# Patient Record
Sex: Male | Born: 1960 | Race: Black or African American | Hispanic: No | Marital: Married | Smoking: Current every day smoker
Health system: Southern US, Community
[De-identification: ages and names within clinical notes are randomized; demographics above are authoritative.]

---

## 2009-04-23 ENCOUNTER — Emergency Department (HOSPITAL_COMMUNITY): Admission: EM | Admit: 2009-04-23 | Discharge: 2009-04-23 | Payer: Self-pay | Admitting: Emergency Medicine

## 2009-04-23 ENCOUNTER — Ambulatory Visit: Payer: Self-pay | Admitting: Psychiatry

## 2009-04-23 ENCOUNTER — Inpatient Hospital Stay (HOSPITAL_COMMUNITY): Admission: AD | Admit: 2009-04-23 | Discharge: 2009-04-26 | Payer: Self-pay | Admitting: Psychiatry

## 2009-05-03 ENCOUNTER — Emergency Department (HOSPITAL_COMMUNITY): Admission: EM | Admit: 2009-05-03 | Discharge: 2009-05-03 | Payer: Self-pay | Admitting: Emergency Medicine

## 2010-05-06 LAB — URINALYSIS, ROUTINE W REFLEX MICROSCOPIC
Bilirubin Urine: NEGATIVE
Hgb urine dipstick: NEGATIVE
Nitrite: NEGATIVE
Protein, ur: NEGATIVE mg/dL
Specific Gravity, Urine: 1.025 (ref 1.005–1.030)
Urobilinogen, UA: 0.2 mg/dL (ref 0.0–1.0)

## 2010-05-06 LAB — RAPID URINE DRUG SCREEN, HOSP PERFORMED
Amphetamines: NOT DETECTED
Barbiturates: NOT DETECTED
Opiates: NOT DETECTED
Tetrahydrocannabinol: NOT DETECTED

## 2010-05-06 LAB — DIFFERENTIAL
Basophils Absolute: 0 10*3/uL (ref 0.0–0.1)
Basophils Relative: 0 % (ref 0–1)
Eosinophils Absolute: 0 10*3/uL (ref 0.0–0.7)
Eosinophils Relative: 0 % (ref 0–5)
Lymphocytes Relative: 12 % (ref 12–46)
Lymphs Abs: 1.3 10*3/uL (ref 0.7–4.0)
Monocytes Absolute: 0.4 10*3/uL (ref 0.1–1.0)
Monocytes Relative: 4 % (ref 3–12)
Neutro Abs: 8.9 10*3/uL — ABNORMAL HIGH (ref 1.7–7.7)
Neutrophils Relative %: 84 % — ABNORMAL HIGH (ref 43–77)

## 2010-05-06 LAB — CBC
Hemoglobin: 14.7 g/dL (ref 13.0–17.0)
RBC: 4.75 MIL/uL (ref 4.22–5.81)
WBC: 10.6 10*3/uL — ABNORMAL HIGH (ref 4.0–10.5)

## 2010-05-06 LAB — BASIC METABOLIC PANEL
Calcium: 9 mg/dL (ref 8.4–10.5)
Creatinine, Ser: 0.95 mg/dL (ref 0.4–1.5)
GFR calc Af Amer: >60 mL/min (ref >60)
GFR calc non Af Amer: >60 mL/min (ref >60)
Sodium: 142 mEq/L (ref 135–145)

## 2010-05-06 LAB — SALICYLATE LEVEL: Salicylate Lvl: <4 mg/dL (ref 2.8–20.0)

## 2010-05-06 LAB — ETHANOL: Alcohol, Ethyl (B): <5 mg/dL (ref 0–10)

## 2010-09-03 ENCOUNTER — Emergency Department (HOSPITAL_COMMUNITY)
Admission: EM | Admit: 2010-09-03 | Discharge: 2010-09-04 | Disposition: A | Discharge status: Home / Self Care | Payer: Self-pay | Attending: Emergency Medicine | Admitting: Emergency Medicine

## 2010-09-03 DIAGNOSIS — M47817 Spondylosis without myelopathy or radiculopathy, lumbosacral region: Secondary | ICD-10-CM | POA: Insufficient documentation

## 2010-09-03 DIAGNOSIS — M1610 Unilateral primary osteoarthritis, unspecified hip: Secondary | ICD-10-CM | POA: Insufficient documentation

## 2010-09-03 DIAGNOSIS — M545 Low back pain, unspecified: Secondary | ICD-10-CM | POA: Insufficient documentation

## 2010-09-03 DIAGNOSIS — R350 Frequency of micturition: Secondary | ICD-10-CM | POA: Insufficient documentation

## 2010-09-03 DIAGNOSIS — F329 Major depressive disorder, single episode, unspecified: Secondary | ICD-10-CM | POA: Insufficient documentation

## 2010-09-03 DIAGNOSIS — R109 Unspecified abdominal pain: Secondary | ICD-10-CM | POA: Insufficient documentation

## 2010-09-03 DIAGNOSIS — G40909 Epilepsy, unspecified, not intractable, without status epilepticus: Secondary | ICD-10-CM | POA: Insufficient documentation

## 2010-09-03 DIAGNOSIS — W19XXXA Unspecified fall, initial encounter: Secondary | ICD-10-CM | POA: Insufficient documentation

## 2010-09-03 DIAGNOSIS — F3289 Other specified depressive episodes: Secondary | ICD-10-CM | POA: Insufficient documentation

## 2010-09-03 DIAGNOSIS — M169 Osteoarthritis of hip, unspecified: Secondary | ICD-10-CM | POA: Insufficient documentation

## 2010-09-04 ENCOUNTER — Emergency Department (HOSPITAL_COMMUNITY): Payer: Self-pay

## 2010-09-04 LAB — DIFFERENTIAL
Basophils Relative: 0 % (ref 0–1)
Lymphocytes Relative: 38 % (ref 12–46)
Monocytes Relative: 8 % (ref 3–12)
Neutro Abs: 3.6 10*3/uL (ref 1.7–7.7)
Neutrophils Relative %: 52 % (ref 43–77)

## 2010-09-04 LAB — CBC
HCT: 40.5 % (ref 39.0–52.0)
Hemoglobin: 14.4 g/dL (ref 13.0–17.0)
MCH: 32 pg (ref 26.0–34.0)
RBC: 4.5 MIL/uL (ref 4.22–5.81)

## 2010-09-04 LAB — POCT I-STAT, CHEM 8
BUN: 17 mg/dL (ref 6–23)
Chloride: 107 mEq/L (ref 96–112)
Creatinine, Ser: 0.8 mg/dL (ref 0.50–1.35)
Potassium: 3.5 mEq/L (ref 3.5–5.1)
Sodium: 142 mEq/L (ref 135–145)
TCO2: 23 mmol/L (ref 0–100)

## 2010-09-04 LAB — URINALYSIS, ROUTINE W REFLEX MICROSCOPIC
Glucose, UA: NEGATIVE mg/dL
Hgb urine dipstick: NEGATIVE
Ketones, ur: 15 mg/dL — AB
Leukocytes, UA: NEGATIVE
pH: 5.5 (ref 5.0–8.0)

## 2010-10-19 ENCOUNTER — Inpatient Hospital Stay (HOSPITAL_COMMUNITY)
Admission: AD | Admit: 2010-10-19 | Discharge: 2010-10-22 | DRG: 882 | Disposition: A | Discharge status: Home / Self Care | Payer: PRIVATE HEALTH INSURANCE | Source: Ambulatory Visit | Attending: Psychiatry | Admitting: Psychiatry

## 2010-10-19 DIAGNOSIS — IMO0002 Reserved for concepts with insufficient information to code with codable children: Secondary | ICD-10-CM

## 2010-10-19 DIAGNOSIS — F4323 Adjustment disorder with mixed anxiety and depressed mood: Secondary | ICD-10-CM

## 2010-10-19 DIAGNOSIS — R45851 Suicidal ideations: Secondary | ICD-10-CM

## 2010-10-19 DIAGNOSIS — M169 Osteoarthritis of hip, unspecified: Secondary | ICD-10-CM

## 2010-10-19 DIAGNOSIS — Z560 Unemployment, unspecified: Secondary | ICD-10-CM

## 2010-10-19 DIAGNOSIS — M1610 Unilateral primary osteoarthritis, unspecified hip: Secondary | ICD-10-CM

## 2010-10-20 LAB — COMPREHENSIVE METABOLIC PANEL
ALT: 14 U/L (ref 0–53)
Alkaline Phosphatase: 58 U/L (ref 39–117)
CO2: 25 mEq/L (ref 19–32)
GFR calc Af Amer: >60 mL/min (ref >60)
GFR calc non Af Amer: >60 mL/min (ref >60)
Glucose, Bld: 94 mg/dL (ref 70–99)
Potassium: 3.8 mEq/L (ref 3.5–5.1)
Sodium: 142 mEq/L (ref 135–145)
Total Bilirubin: 0.1 mg/dL — ABNORMAL LOW (ref 0.3–1.2)

## 2010-10-20 LAB — CBC
Hemoglobin: 14.6 g/dL (ref 13.0–17.0)
RBC: 4.73 MIL/uL (ref 4.22–5.81)

## 2010-10-20 LAB — TSH: TSH: 0.313 u[IU]/mL — ABNORMAL LOW (ref 0.350–4.500)

## 2010-10-24 NOTE — Discharge Summary (Signed)
  NAMEMarland Kitchen  Ian Santana, Ian Santana NO.:  [id number]  MEDICAL RECORD NO.:  [id number]  LOCATION:  0506                          FACILITY:  [facility]  PHYSICIAN:  Franchot Gallo, MD     DATE OF BIRTH:  March 07, 1960  DATE OF ADMISSION:  10/19/2010 DATE OF DISCHARGE:  10/22/2010                              DISCHARGE SUMMARY   REASON FOR ADMISSION:  This is a 50 year old male who presented with a suicidal plan with a plan to jump in front of a moving car.  He acknowledged he had used cocaine the night before.  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  FINAL IMPRESSION:   Axis I:  Adjustment disorder with mixed anxiety and depressed mood. Axis II:  Deferred. Axis III:  No acute illnesses. Axis IV:  Marital discord, work-related stress. Axis V: Global Assessment of Functioning at discharge is 70.  SIGNIFICANT FINDINGS:  The patient was admitted to the adult milieu for safety and stabilization and was empirically started on Remeron 50 mg for sleep.  He gave consent to speak with his wife.  He was participating in treatment and discharge planning groups and endorsed having marital problems.  On day of discharge, the patient was seen in the interdisciplinary treatment team.  He did not want to be started on any medications.  He felt he was ready to go home.  He was bright and cheerful and was ready for discharge.  His sleep was good.  Appetite was good.  His depression had resolved, rating it a 0 on a scale of 1-10, adamantly denying any suicidal or homicidal thoughts or psychotic symptoms.  His anxiety had resolved, rating it a 0 on a scale of 1-10.  DISCHARGE MEDICATIONS:  None.  FOLLOWUP:  Follow up with [facility] at phone number [phone].  The patient was to go to the walk-in clinic on Wednesday, September 12th.  He also had appointment with Irving Burton at [facility] on Friday, September 14th  at 9:00 a.m.  He was also to call [facility] at [phone] to arrange marital counseling if needed.     Landry Corporal, N.P.   ______________________________ Franchot Gallo, MD    JO/MEDQ  D:  10/23/2010  T:  10/24/2010  Job:  [id number]  Electronically Signed by Limmie PatriciaP. on 10/24/2010 09:25:40 AM Electronically Signed by Franchot Gallo MD on 10/24/2010 03:34:47 PM

## 2010-10-27 NOTE — Assessment & Plan Note (Signed)
NAMEMarland Santana  HUIE, Ian NO.:  [id number]  MEDICAL RECORD NO.:  [id number]  LOCATION:  0506                          FACILITY:  [facility]  PHYSICIAN:  Franchot Gallo, MD     DATE OF BIRTH:  1960-10-02  DATE OF ADMISSION:  10/19/2010 DATE OF DISCHARGE:                      PSYCHIATRIC ADMISSION ASSESSMENT   This is a 50 year old married African American male.  He presented as a walk-in here at the [facility] yesterday from [facility].  He reported he was suicidal, with a plan to jump in front of a moving car. He acknowledged that he had used cocaine the night before.  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  He quit because his wife refused to continue transporting him to work in their only vehicle.  He reports his spouse is verbally abusive, controlling.  She has 6 children, 2 of whom are autistic.  Apparently right now he says his thoughts are really mixed up.  He is quite irritable.  He is not willing to participate in the interview.  He states I already gave this information, etc. Unfortunately it was not presented that he had not been to the emergency department, so only his TSH was checked which actually is low.  It is 0.0313.  The range is 0.350 to 4.5, and this may in fact be his problem.  PAST PSYCHIATRIC HISTORY:  He was with [place] in March 2013.  He was here 03/14 to 03/17, and he was admitted at that time for depression with suicidal ideation.  Currently he is not on any medications.  It is not clear how long he continued psychiatric care after his last admission.  SOCIAL HISTORY:  He reports 1-1/2 years of college.  He was in the [employer] 79-81.  He has never activated his VA benefits.  He is currently married.  He has 5 biological children of his own.  His new wife has 6.  He quit his job.  He denies alcohol and drug history.  He says he did not use any crack, he  just used powder and that he just used it the 1 time.  He is not known to have a substance abuse issue.  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  MEDICATIONS:  He is not prescribed.  ALLERGIES:  He has no known drug allergies.  POSITIVE PHYSICAL FINDINGS:  We are getting his routine labs.  They are pending.  MENTAL STATUS EXAM:  AXIS I:  He is alert and oriented.  He is casually but appropriately groomed, dressed and nourished.  His speech is not pressured.  His mood is irritable.  His thought processes are somewhat clear, rational and goal oriented.  Judgment and insight are poor. Concentration and memory superficially are intact.  Intelligence is average.  He is suicidal, with a plan to jump in front of a car.  He is not homicidal.  He is not having any auditory or visual hallucinations that we are aware of.  Cocaine abuse by his report.  Depressive disorder versus adjustment disorder with mixed reaction of emotions  and conduct. AXIS II:  Is deferred. 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  Legal history: He was has a history 15 years ago of being incarcerated due to possession. AXIS V:  35.  PLAN:  Admit for safety and stabilization.  He was empirically started on Remeron 15 mg at sleep.  As already stated, his labs will be further checked, and further adjustments will be done as indicated.  He will probably need to have further evaluation of his hyperthyroid condition.  Estimated length of stay is 3-5 days.     Mickie Leonarda Salon, P.A.-C.   ______________________________ Franchot Gallo, MD    MD/MEDQ  D:  10/20/2010  T:  10/20/2010  Job:  [id number]  Electronically Signed by Jaci Lazier ADAMS P.A.-C. on 10/27/2010 12:24:10 PM Electronically Signed by Franchot Gallo MD on 10/27/2010 09:21:14 PM

## 2011-11-19 ENCOUNTER — Telehealth: Payer: Self-pay

## 2011-11-19 NOTE — Telephone Encounter (Signed)
Spoke with patient he has not been here at [facility] at a Patient or for Work.

## 2011-11-19 NOTE — Telephone Encounter (Signed)
Patient request that we fax TB test to [phone]. Patient needs it ASAP or he could be suspended from work.

## 2012-09-03 IMAGING — CR DG PELVIS 1-2V
1 series · 1 of 1 positions shown · non-contrast
Comparison: None.

CLINICAL DATA: Pain post fall

PELVIS - 1-2 VIEW

[t pelvis ap]
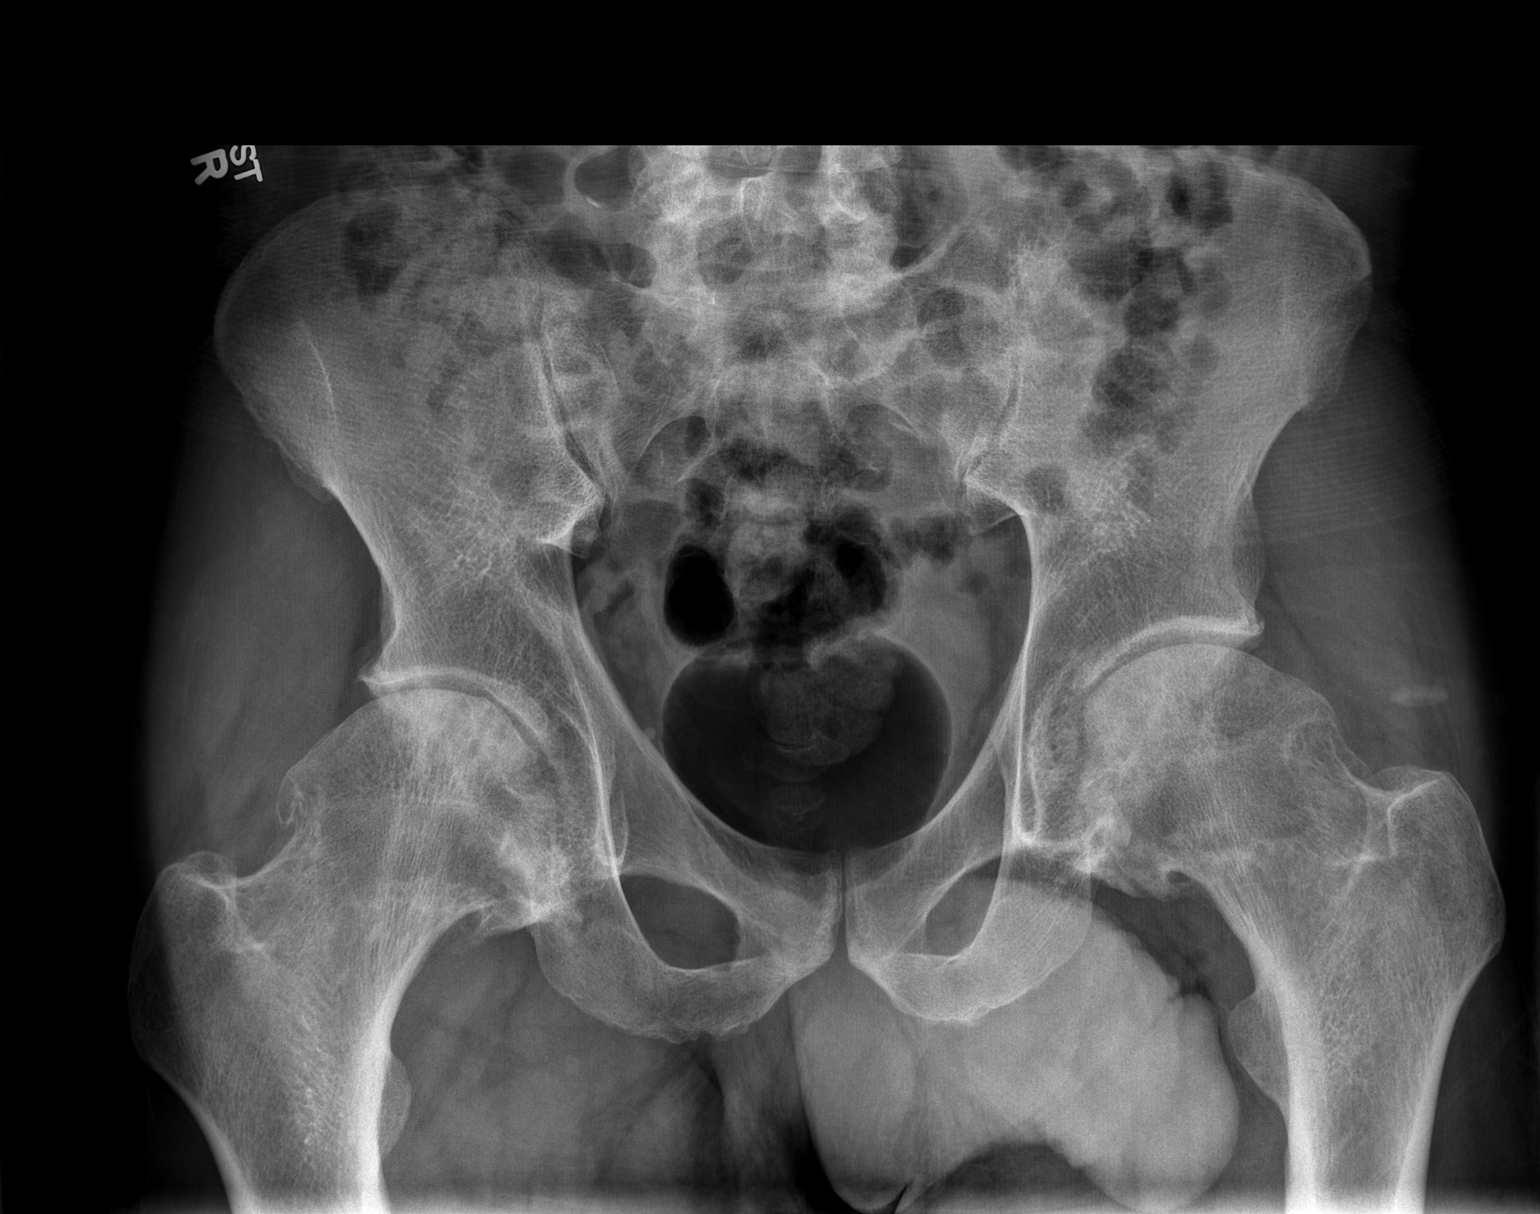

[1 of 1 positions shown; findings below may reference images not displayed]

FINDINGS: There is narrowing of the articular cartilage in both
hips with small marginal spurs from the femoral heads and superior
margins of the acetabula.  There is sclerosis in the femoral heads
with small subchondral cysts or geodes.  Negative for fracture,
dislocation, or other acute bony abnormality.
IMPRESSION: 1.  Moderately advanced bilateral hip osteoarthritis.  No fracture
or other acute abnormality.

## 2012-09-03 IMAGING — CR DG CHEST 2V
2 series · 2 of 2 positions shown · non-contrast
Comparison: None.

CLINICAL DATA: Weight loss.

CHEST - 2 VIEW

[w chest lat]
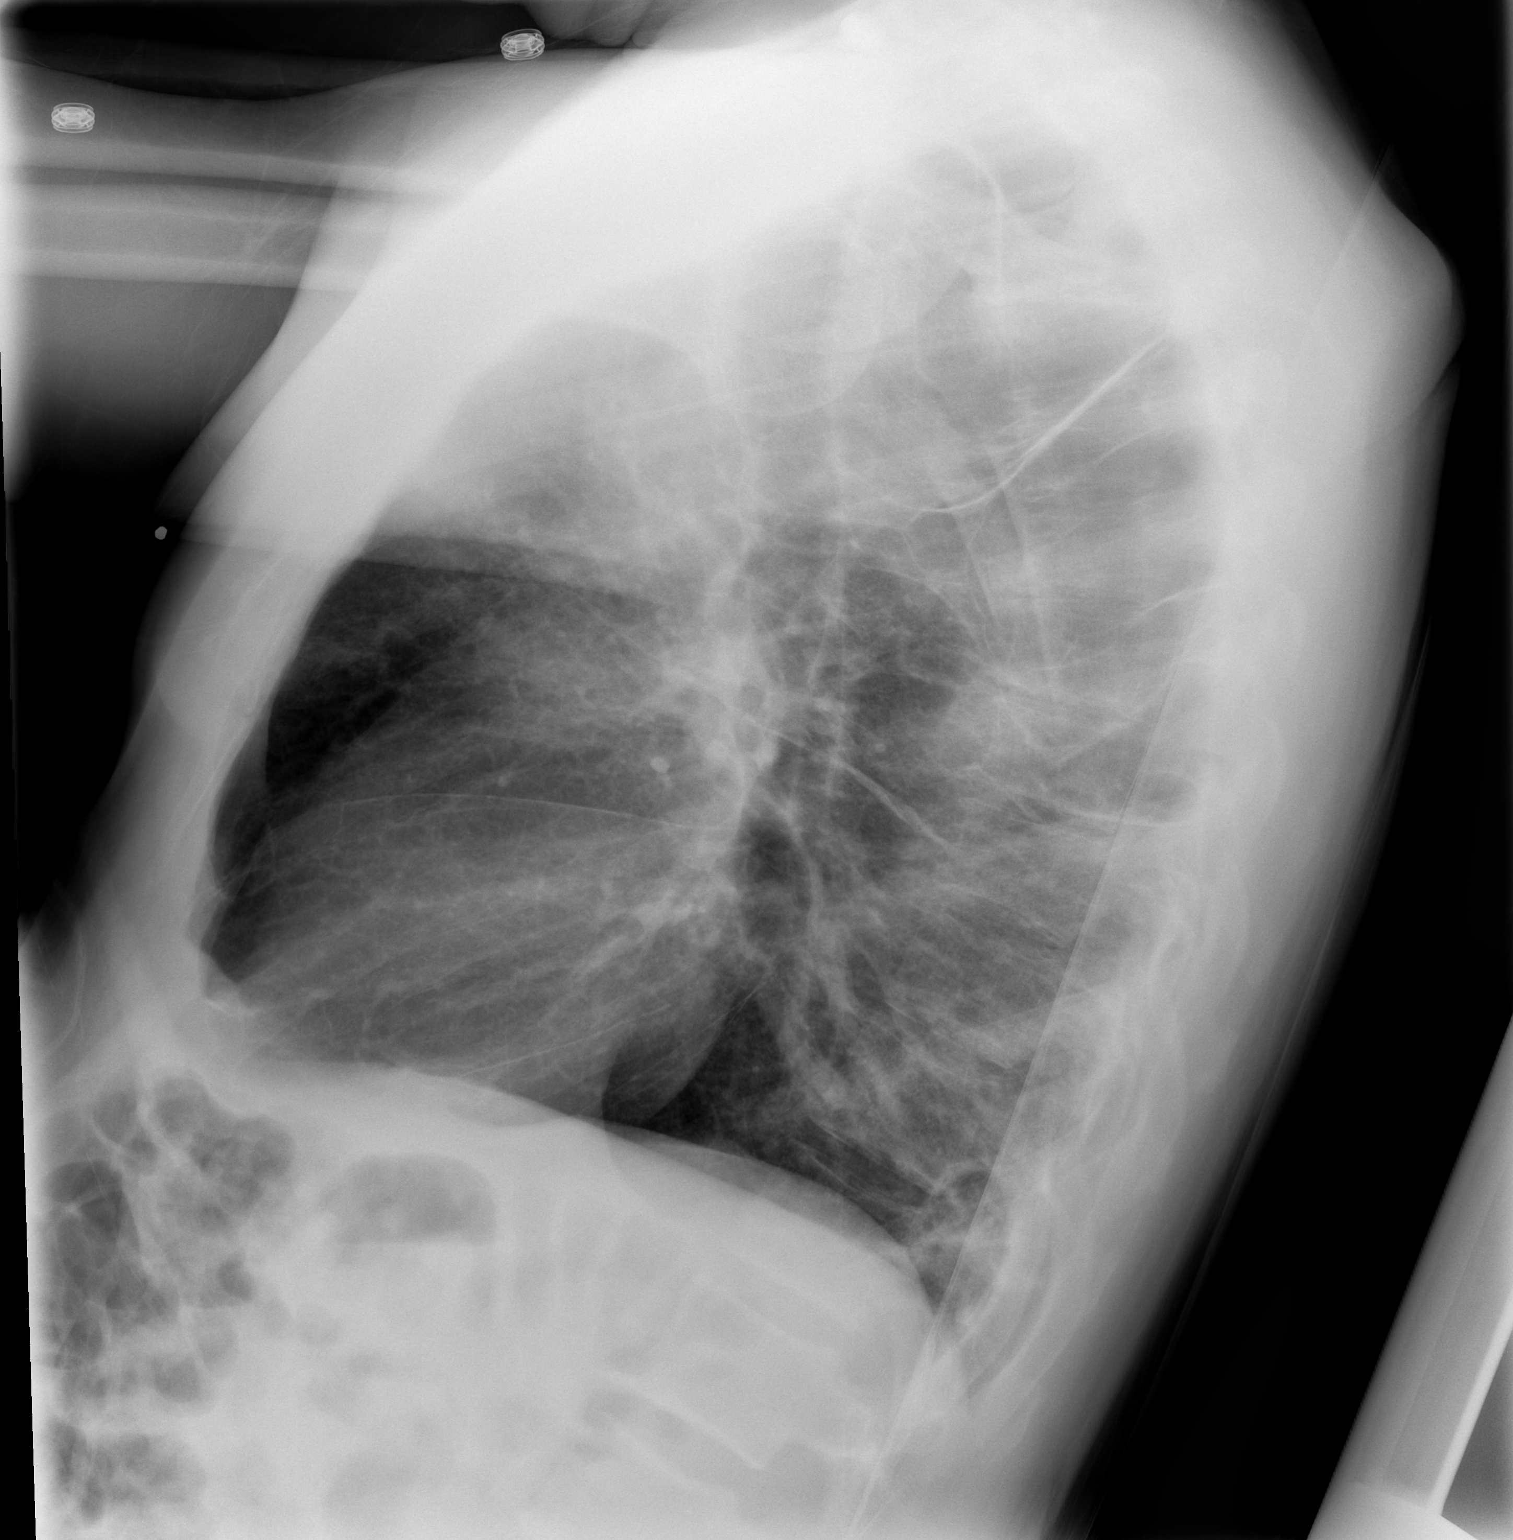

[w chest ap]
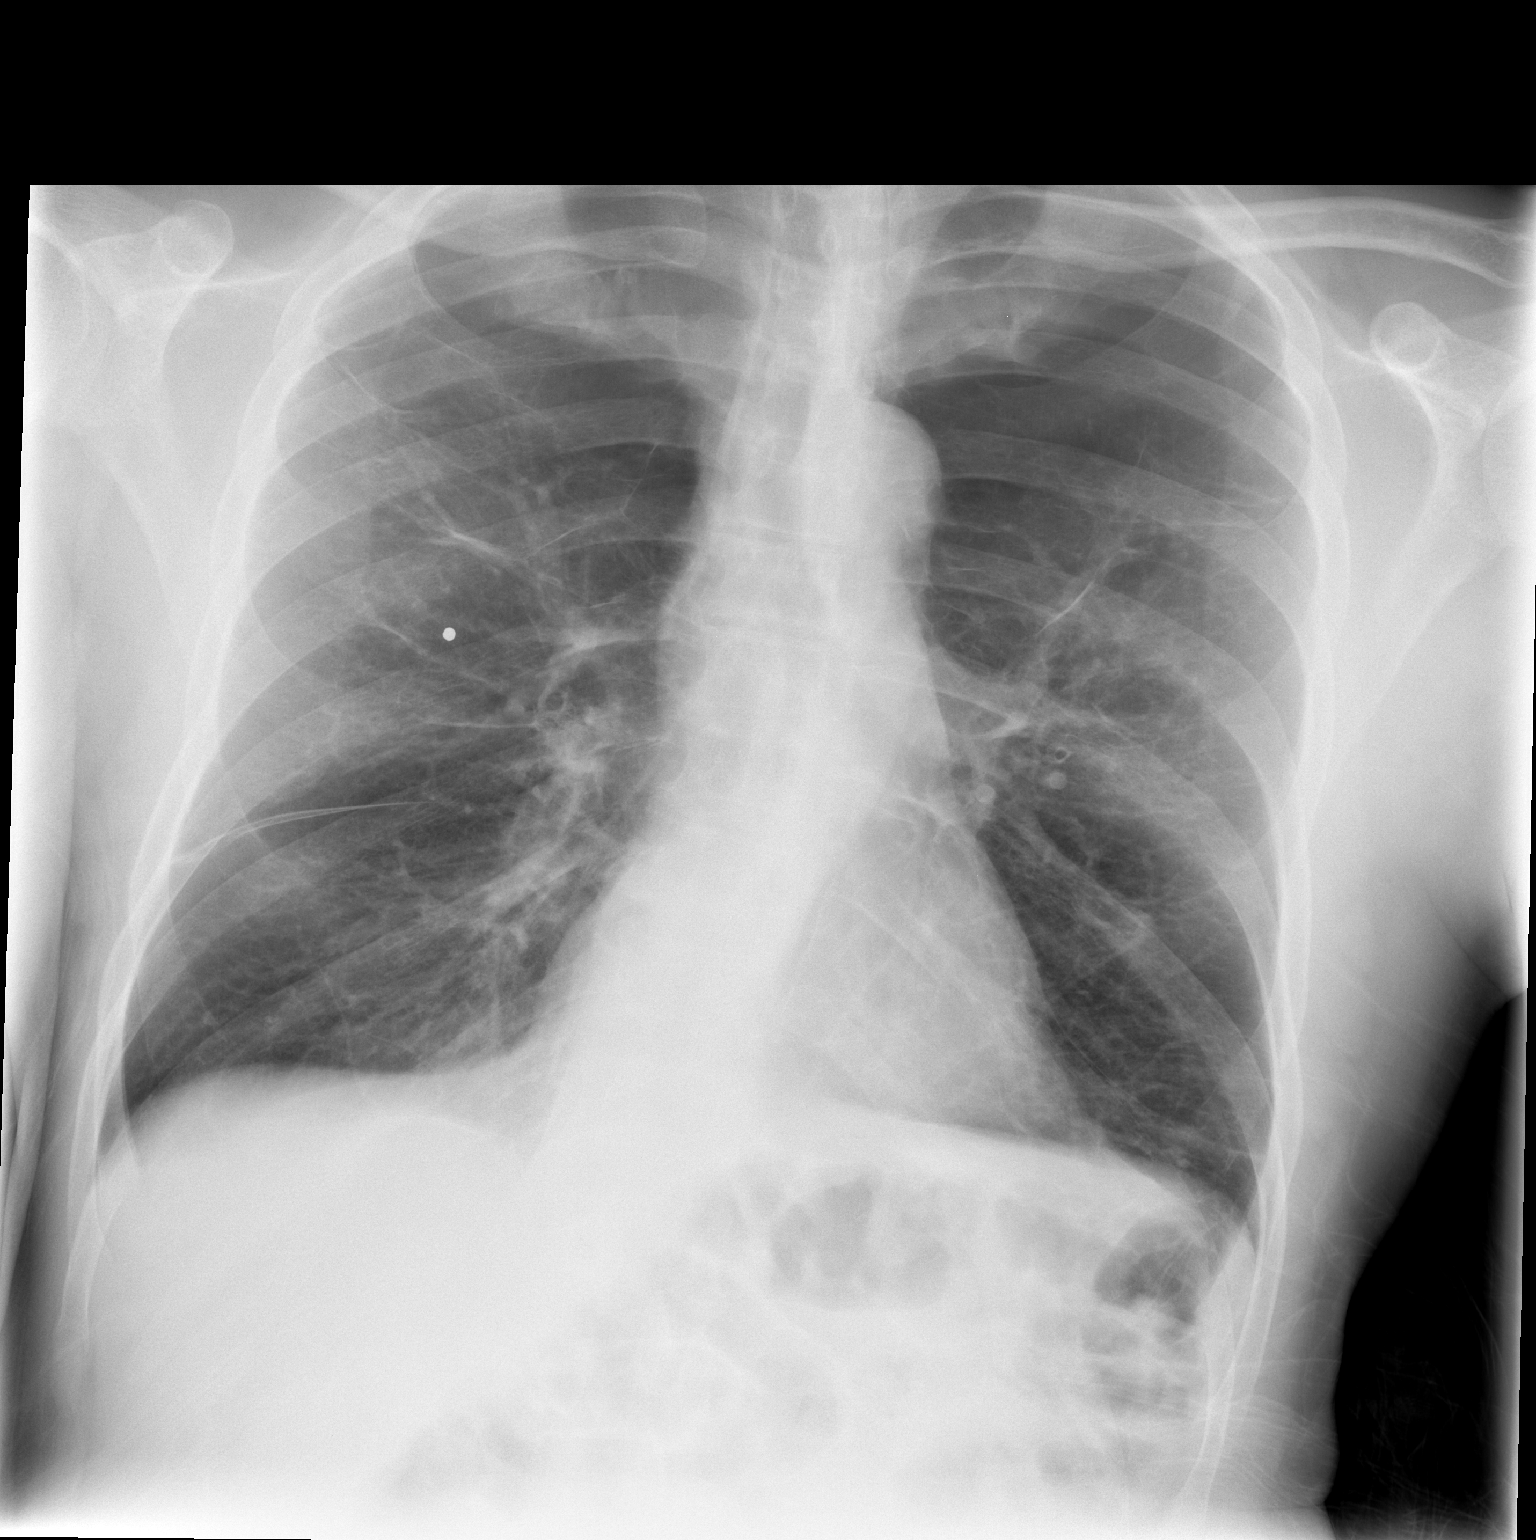

[2 of 2 positions shown; findings below may reference images not displayed]

FINDINGS: Coarse interstitial markings with bullous changes.
Metallic density projecting over the right chest may be sequelae of
prior trauma. No pleural effusion or focal consolidation
identified.  No definite pneumothorax.  Cardiomediastinal contours
within normal limits.  Mild multilevel degenerative changes.
IMPRESSION: COPD changes. No definite acute process identified.

## 2012-09-03 IMAGING — CR DG LUMBAR SPINE COMPLETE 4+V
5 series · 5 of 5 positions shown · non-contrast
Comparison: None.

CLINICAL DATA: Fell, low back pain

LUMBAR SPINE - COMPLETE 4+ VIEW

[t lumbar spine ap]
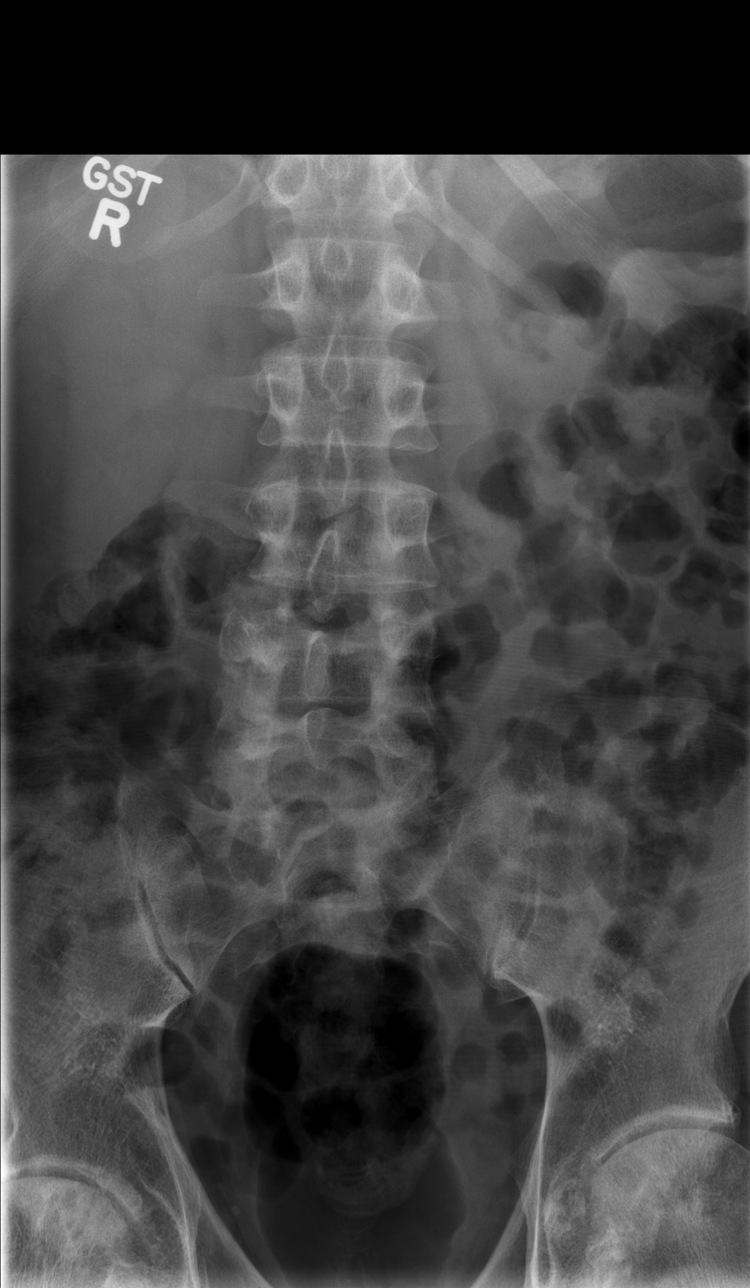

[t lumbar spine obl (1 of 2)]
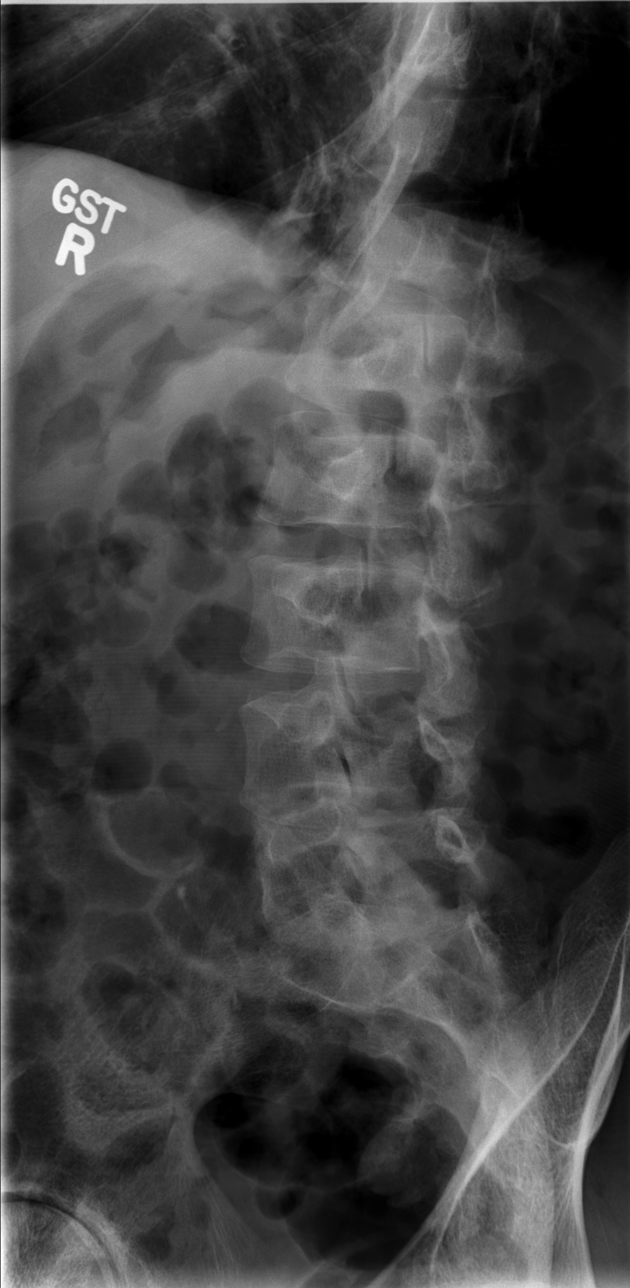

[t lumbar spine obl (2 of 2)]
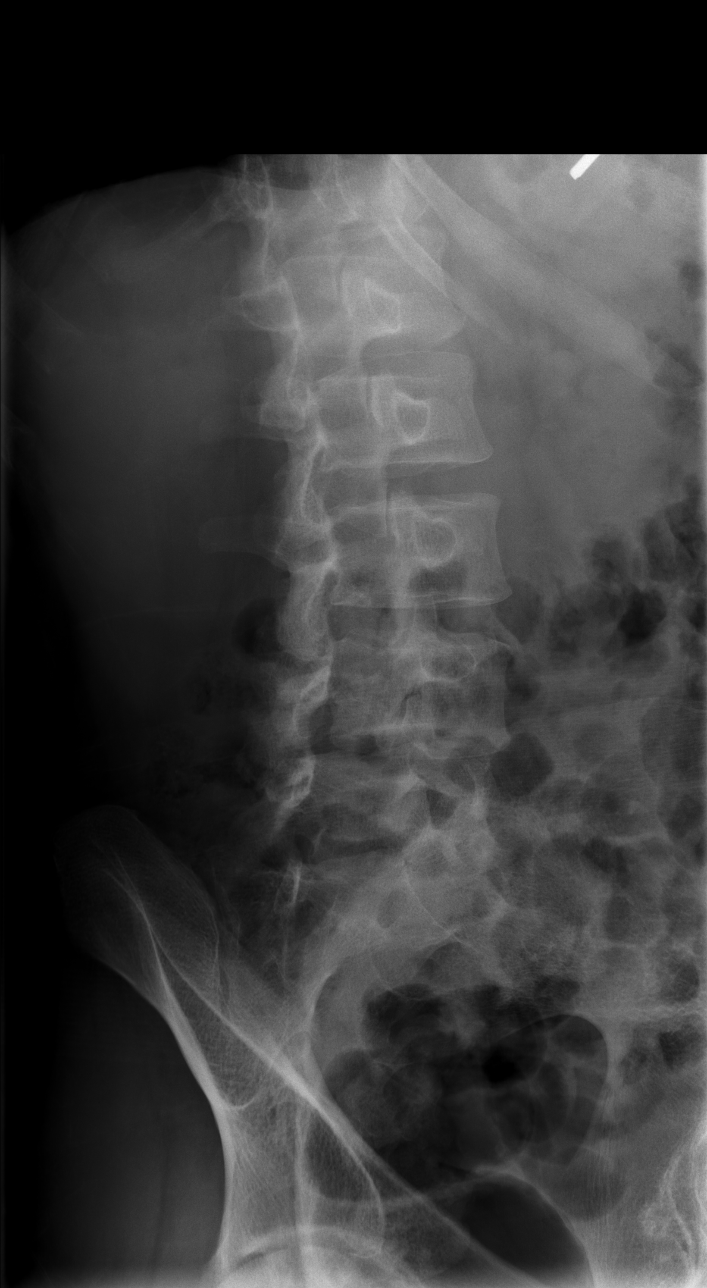

[t lumbar spine lat]
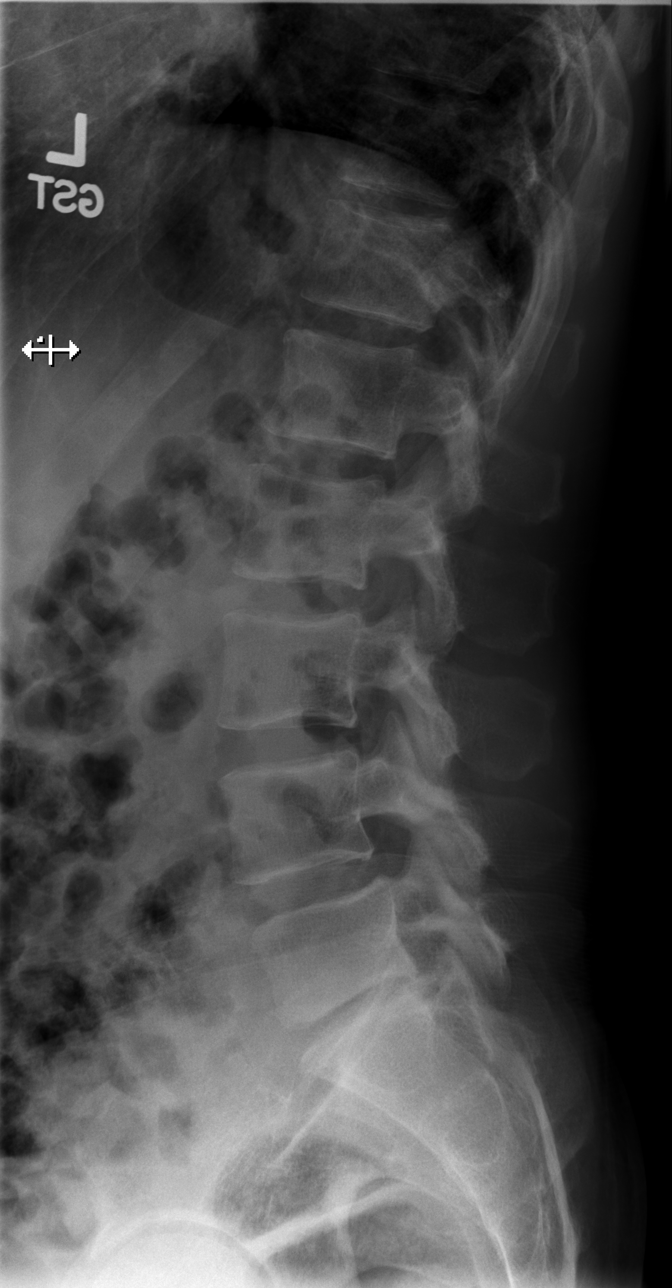

[t lumbar l-5 s-1 spot]
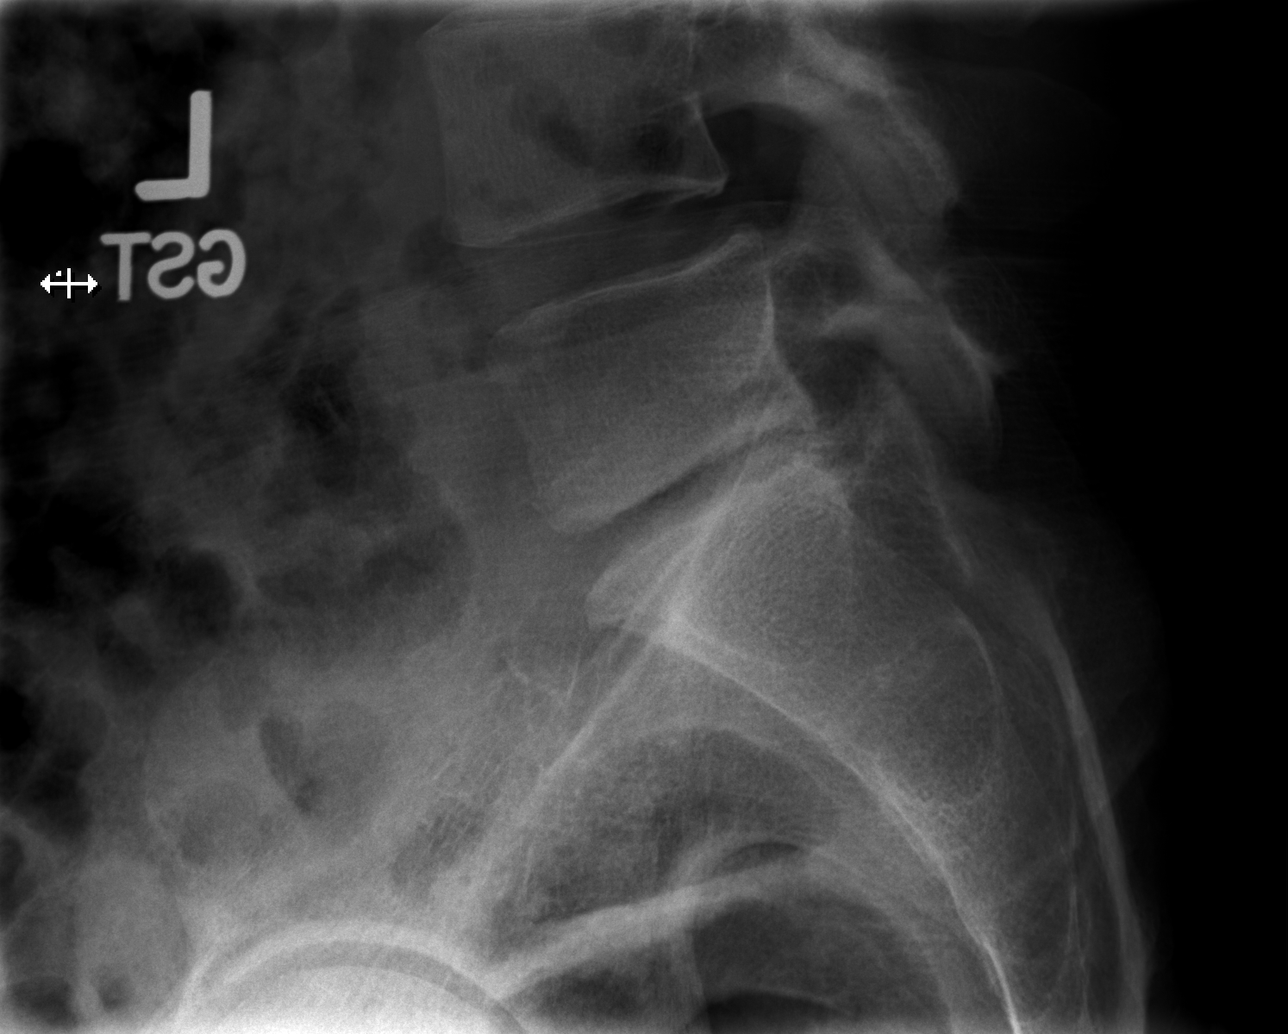

[5 of 5 positions shown; findings below may reference images not displayed]

FINDINGS: Degenerative changes in bilateral hips, incompletely
visualized.    There is mild narrowing of the L5-S1 interspace.
Vertebral body height well maintained throughout. Negative for
fracture.
IMPRESSION: 1. Early degenerative disc disease L5-S1.
2.  Negative for fracture.

## 2015-12-05 ENCOUNTER — Emergency Department (HOSPITAL_COMMUNITY): Payer: Self-pay

## 2015-12-05 ENCOUNTER — Encounter (HOSPITAL_COMMUNITY): Payer: Self-pay

## 2015-12-05 ENCOUNTER — Inpatient Hospital Stay (HOSPITAL_COMMUNITY)
Admission: EM | Admit: 2015-12-05 | Discharge: 2015-12-07 | DRG: 190 | Disposition: A | Discharge status: Home / Self Care | Payer: Self-pay | Attending: Internal Medicine | Admitting: Internal Medicine

## 2015-12-05 DIAGNOSIS — T886XXA Anaphylactic reaction due to adverse effect of correct drug or medicament properly administered, initial encounter: Secondary | ICD-10-CM | POA: Diagnosis not present

## 2015-12-05 DIAGNOSIS — Y92239 Unspecified place in hospital as the place of occurrence of the external cause: Secondary | ICD-10-CM | POA: Diagnosis not present

## 2015-12-05 DIAGNOSIS — F17200 Nicotine dependence, unspecified, uncomplicated: Secondary | ICD-10-CM | POA: Diagnosis present

## 2015-12-05 DIAGNOSIS — E43 Unspecified severe protein-calorie malnutrition: Secondary | ICD-10-CM | POA: Diagnosis present

## 2015-12-05 DIAGNOSIS — T464X5A Adverse effect of angiotensin-converting-enzyme inhibitors, initial encounter: Secondary | ICD-10-CM | POA: Diagnosis not present

## 2015-12-05 DIAGNOSIS — J069 Acute upper respiratory infection, unspecified: Secondary | ICD-10-CM | POA: Diagnosis present

## 2015-12-05 DIAGNOSIS — Z888 Allergy status to other drugs, medicaments and biological substances status: Secondary | ICD-10-CM

## 2015-12-05 DIAGNOSIS — R634 Abnormal weight loss: Secondary | ICD-10-CM

## 2015-12-05 DIAGNOSIS — B9789 Other viral agents as the cause of diseases classified elsewhere: Secondary | ICD-10-CM

## 2015-12-05 DIAGNOSIS — Z681 Body mass index (BMI) 19 or less, adult: Secondary | ICD-10-CM

## 2015-12-05 DIAGNOSIS — I10 Essential (primary) hypertension: Secondary | ICD-10-CM | POA: Diagnosis present

## 2015-12-05 DIAGNOSIS — R0682 Tachypnea, not elsewhere classified: Secondary | ICD-10-CM | POA: Diagnosis present

## 2015-12-05 DIAGNOSIS — B348 Other viral infections of unspecified site: Secondary | ICD-10-CM

## 2015-12-05 DIAGNOSIS — J441 Chronic obstructive pulmonary disease with (acute) exacerbation: Principal | ICD-10-CM | POA: Diagnosis present

## 2015-12-05 LAB — BASIC METABOLIC PANEL
ANION GAP: 7 (ref 5–15)
BUN: 12 mg/dL (ref 6–20)
CHLORIDE: 110 mmol/L (ref 101–111)
CO2: 23 mmol/L (ref 22–32)
Calcium: 8.8 mg/dL — ABNORMAL LOW (ref 8.9–10.3)
Creatinine, Ser: 1 mg/dL (ref 0.61–1.24)
Glucose, Bld: 70 mg/dL (ref 65–99)
POTASSIUM: 3.9 mmol/L (ref 3.5–5.1)
SODIUM: 140 mmol/L (ref 135–145)

## 2015-12-05 LAB — CBC WITH DIFFERENTIAL/PLATELET
BASOS ABS: 0 10*3/uL (ref 0.0–0.1)
Basophils Relative: 1 %
Eosinophils Absolute: 0.1 10*3/uL (ref 0.0–0.7)
Eosinophils Relative: 2 %
HCT: 42.1 % (ref 39.0–52.0)
HEMOGLOBIN: 14.8 g/dL (ref 13.0–17.0)
LYMPHS ABS: 1.9 10*3/uL (ref 0.7–4.0)
LYMPHS PCT: 36 %
MCH: 32 pg (ref 26.0–34.0)
MCHC: 35.2 g/dL (ref 30.0–36.0)
MCV: 91.1 fL (ref 78.0–100.0)
Monocytes Absolute: 0.4 10*3/uL (ref 0.1–1.0)
Monocytes Relative: 8 %
NEUTROS ABS: 2.9 10*3/uL (ref 1.7–7.7)
NEUTROS PCT: 53 %
Platelets: 285 10*3/uL (ref 150–400)
RBC: 4.62 MIL/uL (ref 4.22–5.81)
RDW: 14.4 % (ref 11.5–15.5)
WBC: 5.4 10*3/uL (ref 4.0–10.5)

## 2015-12-05 LAB — I-STAT TROPONIN, ED: Troponin i, poc: 0 ng/mL (ref 0.00–0.08)

## 2015-12-05 LAB — I-STAT CG4 LACTIC ACID, ED: LACTIC ACID, VENOUS: 1.48 mmol/L (ref 0.5–1.9)

## 2015-12-05 MED ORDER — AZITHROMYCIN 250 MG PO TABS
500.0000 mg | ORAL_TABLET | Freq: Once | ORAL | Status: AC
  Administered 2015-12-05: 500 mg via ORAL
  Filled 2015-12-05: qty 2

## 2015-12-05 MED ORDER — IPRATROPIUM-ALBUTEROL 0.5-2.5 (3) MG/3ML IN SOLN
3.0000 mL | Freq: Once | RESPIRATORY_TRACT | Status: AC
  Administered 2015-12-05: 3 mL via RESPIRATORY_TRACT
  Filled 2015-12-05: qty 3

## 2015-12-05 MED ORDER — METHYLPREDNISOLONE SODIUM SUCC 125 MG IJ SOLR
125.0000 mg | Freq: Once | INTRAMUSCULAR | Status: AC
  Administered 2015-12-05: 125 mg via INTRAVENOUS
  Filled 2015-12-05: qty 2

## 2015-12-05 NOTE — Progress Notes (Signed)
Patient listed as not having a pcp or insurance.  EDCM spoke to patient at bedside.  [place]  EDCM provided patient [place] who accept patients without insurance.  EDCM also provided patient with contact information to the [facility] and DSS of [place].  Encouraged patient to see if he would qualify for Medicaid.  Patient thankful for services.  No further EDCM needs at this time.

## 2015-12-05 NOTE — ED Triage Notes (Signed)
Pt complains of a productive cough, body aches and being short of breath for two days, was seen by his primary Dr and sent here for further evaluation and possible pneumonia

## 2015-12-05 NOTE — ED Notes (Signed)
Pt transported to radiology.

## 2015-12-05 NOTE — ED Provider Notes (Signed)
[facility]-EMERGENCY DEPT Provider Note   CSN: [id number] Arrival date & time: 12/05/15  1842     History   Chief Complaint Chief Complaint  Patient presents with  . Cough    HPI Ian Santana is a 55 y.o. male.  55yo M w/ h/o tobacco use who p/w cough, body aches, and SOB. A few days ago the patient developed a cough associated with runny nose and mild sore throat. He has had associated subjective fevers and chills. He has had a gradually progressive shortness of breath since yesterday. Today he began having anal on bilateral ribs that is worse with coughing. No sick contacts, recent travel. No rash. He endorses generalized joint pains and body aches. He has had some nausea and vomiting, no diarrhea. He was seen by PCP who sent him here for further evaluation. He tested negative for influenza at the office.   The history is provided by the patient.  Cough     History reviewed. No pertinent past medical history.  Patient Active Problem List   Diagnosis Date Noted  . COPD exacerbation (HCC) 12/06/2015    History reviewed. No pertinent surgical history.     Home Medications    Prior to Admission medications   Not on File    Family History History reviewed. No pertinent family history.  Social History Social History  Substance Use Topics  . Smoking status: Current Every Day Smoker  . Smokeless tobacco: Never Used  . Alcohol use No     Allergies   Review of patient's allergies indicates no known allergies.   Review of Systems Review of Systems  Respiratory: Positive for cough.    10 Systems reviewed and are negative for acute change except as noted in the HPI.   Physical Exam Updated Vital Signs BP (!) 146/109   Pulse 74   Temp 99 F (37.2 C) (Oral)   Resp 20   Ht 6\' 5"  (1.956 m)   Wt 212 lb (96.2 kg)   SpO2 (!) 89%   BMI 25.14 kg/m   Physical Exam  Constitutional: He is oriented to person, place, and time. He appears well-developed and  well-nourished. No distress.  uncomfortable  HENT:  Head: Normocephalic and atraumatic.  Mouth/Throat: Oropharynx is clear and moist.  Moist mucous membranes  Eyes: Conjunctivae are normal. Pupils are equal, round, and reactive to light.  Neck: Neck supple.  Cardiovascular: Normal rate, regular rhythm and normal heart sounds.   No murmur heard. Pulmonary/Chest:  Mild tachypnea; expiratory wheezes R upper and lower lung, diminished BS L lung  Abdominal: Soft. Bowel sounds are normal. He exhibits no distension. There is no tenderness.  Musculoskeletal: He exhibits no edema.  Neurological: He is alert and oriented to person, place, and time.  Fluent speech  Skin: Skin is warm and dry. No rash noted.  Psychiatric: He has a normal mood and affect. Judgment normal.  Nursing note and vitals reviewed.    ED Treatments / Results  Labs (all labs ordered are listed, but only abnormal results are displayed) Labs Reviewed  BASIC METABOLIC PANEL - Abnormal; Notable for the following:       Result Value   Calcium 8.8 (*)    All other components within normal limits  CULTURE, BLOOD (ROUTINE X 2)  CULTURE, BLOOD (ROUTINE X 2)  CBC WITH DIFFERENTIAL/PLATELET  I-STAT CG4 LACTIC ACID, ED  Rosezena Sensor, ED    EKG  EKG Interpretation  Date/Time:  Wednesday December 05 2015 20:27:44 EDT Ventricular  Rate:  78 PR Interval:    QRS Duration: 75 QT Interval:  360 QTC Calculation: 410 R Axis:   82 Text Interpretation:  Sinus rhythm Ventricular premature complex LAE, consider biatrial enlargement Anteroseptal infarct, old Nonspecific T abnormalities, inferior leads No previous ECGs available Confirmed by Landon Truax MD, Lorna Strother [phone]([id number]) on 12/05/2015 9:02:19 PM       Radiology Dg Chest 2 View  Result Date: 12/05/2015 CLINICAL DATA:  Productive cough and body ache EXAM: CHEST  2 VIEW COMPARISON:  09/04/2010 FINDINGS: Upper lobe predominant bullous emphysema is again seen with crowding of lower  lobe interstitial lung markings. There is hyperinflation of the lungs bilaterally without pneumonic consolidation, CHF, effusions or pneumothorax. The heart and mediastinal contours are stable with mild uncoiling of the thoracic aorta. No acute osseous abnormality. IMPRESSION: COPD without acute pneumonic consolidation. Electronically Signed   By: Tollie Ethavid  Kwon M.D.   On: 12/05/2015 20:52    Procedures Procedures (including critical care time)  Medications Ordered in ED Medications  ipratropium-albuterol (DUONEB) 0.5-2.5 (3) MG/3ML nebulizer solution 3 mL (3 mLs Nebulization Given 12/05/15 2124)  methylPREDNISolone sodium succinate (SOLU-MEDROL) 125 mg/2 mL injection 125 mg (125 mg Intravenous Given 12/05/15 2121)  azithromycin (ZITHROMAX) tablet 500 mg (500 mg Oral Given 12/05/15 2120)  albuterol (PROVENTIL) (2.5 MG/3ML) 0.083% nebulizer solution 5 mg (5 mg Nebulization Given 12/06/15 0038)     Initial Impression / Assessment and Plan / ED Course  I have reviewed the triage vital signs and the nursing notes.  Pertinent labs & imaging results that were available during my care of the patient were reviewed by me and considered in my medical decision making (see chart for details).  Clinical Course   Pt p/w a few days of cough and nasal congestion, now w/ worsening SOB and b/l rib pain. He was awake and alert, in no acute distress at presentation. He was afebrile, O2 92% on room air, reassuring BP. I'm expiratory wheezing and diminished breath sounds on exam. Obtained above lab work as well as EKG and chest x-ray. EKG without acute ischemic changes. Chest x-ray shows COPD findings without any focal consolidation. The patient does not have history of COPD but does have significant smoking history. Gave DuoNeb, Solu-Medrol, and azithromycin.  Labwork unremarkable including normal lactate and troponin. Chest x-ray shows COPD changes without obvious infiltrates. On reexamination, the patient had more  obvious wheezing bilaterally, improved work of breathing but he does remain mildly hypoxic to high 80s during sleep, 92% when awake. Because of his need for frequent albuterol and his hypoxia, discussed admission with Dr. Antionette Charpyd, appreciate assistance. Pt admitted for further treatment. Final Clinical Impressions(s) / ED Diagnoses   Final diagnoses:  COPD exacerbation (HCC)  Viral URI with cough    New Prescriptions New Prescriptions   No medications on file     Laurence Spatesachel Morgan Shere Eisenhart, MD 12/06/15 0045

## 2015-12-06 ENCOUNTER — Encounter (HOSPITAL_COMMUNITY): Payer: Self-pay | Admitting: Family Medicine

## 2015-12-06 DIAGNOSIS — J069 Acute upper respiratory infection, unspecified: Secondary | ICD-10-CM

## 2015-12-06 DIAGNOSIS — I10 Essential (primary) hypertension: Secondary | ICD-10-CM

## 2015-12-06 DIAGNOSIS — J441 Chronic obstructive pulmonary disease with (acute) exacerbation: Secondary | ICD-10-CM | POA: Diagnosis present

## 2015-12-06 DIAGNOSIS — B348 Other viral infections of unspecified site: Secondary | ICD-10-CM

## 2015-12-06 DIAGNOSIS — F17200 Nicotine dependence, unspecified, uncomplicated: Secondary | ICD-10-CM

## 2015-12-06 LAB — RESPIRATORY PANEL BY PCR
Adenovirus: NOT DETECTED
BORDETELLA PERTUSSIS-RVPCR: NOT DETECTED
CHLAMYDOPHILA PNEUMONIAE-RVPPCR: NOT DETECTED
CORONAVIRUS 229E-RVPPCR: NOT DETECTED
CORONAVIRUS HKU1-RVPPCR: NOT DETECTED
Coronavirus NL63: NOT DETECTED
Coronavirus OC43: NOT DETECTED
Influenza A: NOT DETECTED
Influenza B: NOT DETECTED
METAPNEUMOVIRUS-RVPPCR: NOT DETECTED
Mycoplasma pneumoniae: NOT DETECTED
PARAINFLUENZA VIRUS 2-RVPPCR: NOT DETECTED
Parainfluenza Virus 1: NOT DETECTED
Parainfluenza Virus 3: NOT DETECTED
Parainfluenza Virus 4: NOT DETECTED
Respiratory Syncytial Virus: NOT DETECTED
Rhinovirus / Enterovirus: DETECTED — AB

## 2015-12-06 LAB — GLUCOSE, CAPILLARY: GLUCOSE-CAPILLARY: 217 mg/dL — AB (ref 65–99)

## 2015-12-06 MED ORDER — ACETAMINOPHEN 650 MG RE SUPP: 650.0000 mg | Freq: Four times a day (QID) | RECTAL | Status: DC | PRN

## 2015-12-06 MED ORDER — SODIUM CHLORIDE 0.9 % IV SOLN: 250.0000 mL | INTRAVENOUS | Status: DC | PRN

## 2015-12-06 MED ORDER — PREDNISONE 5 MG PO TABS: 5.0000 mg | ORAL_TABLET | Freq: Every day | ORAL | Status: AC

## 2015-12-06 MED ORDER — ENOXAPARIN SODIUM 40 MG/0.4ML ~~LOC~~ SOLN
40.0000 mg | SUBCUTANEOUS | Status: DC
  Administered 2015-12-06: 40 mg via SUBCUTANEOUS
  Filled 2015-12-06 (×2): qty 0.4

## 2015-12-06 MED ORDER — METHYLPREDNISOLONE SODIUM SUCC 125 MG IJ SOLR
60.0000 mg | Freq: Three times a day (TID) | INTRAMUSCULAR | Status: DC
  Administered 2015-12-06 – 2015-12-07 (×4): 60 mg via INTRAVENOUS
  Filled 2015-12-06 (×6): qty 2

## 2015-12-06 MED ORDER — KETOROLAC TROMETHAMINE 30 MG/ML IJ SOLN
30.0000 mg | Freq: Four times a day (QID) | INTRAMUSCULAR | Status: DC | PRN
  Administered 2015-12-07: 30 mg via INTRAVENOUS
  Filled 2015-12-06: qty 1

## 2015-12-06 MED ORDER — ONDANSETRON HCL 4 MG PO TABS: 4.0000 mg | ORAL_TABLET | Freq: Four times a day (QID) | ORAL | Status: DC | PRN

## 2015-12-06 MED ORDER — HYDRALAZINE HCL 20 MG/ML IJ SOLN
10.0000 mg | INTRAMUSCULAR | Status: DC | PRN
Start: 2015-12-06 — End: 2015-12-07

## 2015-12-06 MED ORDER — ONDANSETRON HCL 4 MG/2ML IJ SOLN: 4.0000 mg | Freq: Four times a day (QID) | INTRAMUSCULAR | Status: DC | PRN

## 2015-12-06 MED ORDER — ALBUTEROL SULFATE (2.5 MG/3ML) 0.083% IN NEBU
5.0000 mg | INHALATION_SOLUTION | Freq: Once | RESPIRATORY_TRACT | Status: AC
  Administered 2015-12-06: 5 mg via RESPIRATORY_TRACT
  Filled 2015-12-06: qty 6

## 2015-12-06 MED ORDER — ENSURE ENLIVE PO LIQD
237.0000 mL | Freq: Two times a day (BID) | ORAL | Status: DC
  Administered 2015-12-06 (×2): 237 mL via ORAL

## 2015-12-06 MED ORDER — LEVOFLOXACIN IN D5W 750 MG/150ML IV SOLN
750.0000 mg | INTRAVENOUS | Status: DC
  Administered 2015-12-06: 750 mg via INTRAVENOUS
  Filled 2015-12-06: qty 150

## 2015-12-06 MED ORDER — LISINOPRIL 5 MG PO TABS
5.0000 mg | ORAL_TABLET | Freq: Every day | ORAL | Status: DC
  Administered 2015-12-06: 5 mg via ORAL
  Filled 2015-12-06: qty 1

## 2015-12-06 MED ORDER — IPRATROPIUM-ALBUTEROL 0.5-2.5 (3) MG/3ML IN SOLN
3.0000 mL | RESPIRATORY_TRACT | Status: DC | PRN
  Administered 2015-12-07: 3 mL via RESPIRATORY_TRACT
  Filled 2015-12-06: qty 3

## 2015-12-06 MED ORDER — ACETAMINOPHEN 325 MG PO TABS: 650.0000 mg | ORAL_TABLET | Freq: Four times a day (QID) | ORAL | Status: DC | PRN

## 2015-12-06 MED ORDER — KETOROLAC TROMETHAMINE 10 MG PO TABS: 10.0000 mg | ORAL_TABLET | Freq: Four times a day (QID) | ORAL | 0 refills | Status: AC | PRN

## 2015-12-06 MED ORDER — SODIUM CHLORIDE 0.9% FLUSH
3.0000 mL | Freq: Two times a day (BID) | INTRAVENOUS | Status: DC
  Administered 2015-12-06 – 2015-12-07 (×3): 3 mL via INTRAVENOUS

## 2015-12-06 MED ORDER — ALBUTEROL SULFATE HFA 108 (90 BASE) MCG/ACT IN AERS: 2.0000 | INHALATION_SPRAY | Freq: Four times a day (QID) | RESPIRATORY_TRACT | 0 refills | Status: AC | PRN

## 2015-12-06 MED ORDER — SODIUM CHLORIDE 0.9 % IV BOLUS (SEPSIS)
1000.0000 mL | Freq: Once | INTRAVENOUS | Status: AC
  Administered 2015-12-06: 1000 mL via INTRAVENOUS

## 2015-12-06 MED ORDER — SODIUM CHLORIDE 0.9% FLUSH
3.0000 mL | INTRAVENOUS | Status: DC | PRN
  Administered 2015-12-06 – 2015-12-07 (×2): 3 mL via INTRAVENOUS
  Filled 2015-12-06 (×2): qty 3

## 2015-12-06 MED ORDER — HYDROCODONE-ACETAMINOPHEN 5-325 MG PO TABS
1.0000 | ORAL_TABLET | ORAL | Status: DC | PRN
  Administered 2015-12-06 (×4): 2 via ORAL
  Filled 2015-12-06 (×5): qty 2

## 2015-12-06 MED ORDER — BENZONATATE 100 MG PO CAPS
100.0000 mg | ORAL_CAPSULE | Freq: Three times a day (TID) | ORAL | Status: DC | PRN
  Administered 2015-12-07: 100 mg via ORAL
  Filled 2015-12-06: qty 1

## 2015-12-06 MED ORDER — POLYETHYLENE GLYCOL 3350 17 G PO PACK: 17.0000 g | PACK | Freq: Every day | ORAL | Status: DC | PRN

## 2015-12-06 NOTE — Plan of Care (Signed)
Problem: Education: Goal: Knowledge of [facility] Education information/materials will improve Outcome: Progressing Educated patient regarding smoking cessation. Patient acknowledges need to quit smoking. Reports he is going to quit.  In addition, printed out COPD patient education information.

## 2015-12-06 NOTE — H&P (Signed)
History and Physical    Ian Santana RUE:[id number] DOB: Sep 03, 1960 DOA: 12/05/2015  PCP: No primary care provider on file.   Patient coming from: Home, by way of PCP office   Chief Complaint: Cough, SOB, aches, malaise  HPI: Ian Santana is a 55 y.o. male with medical history significant for ongoing tobacco abuse who presents to the emergency department at the direction of his PCP for evaluation of cough, dyspnea, and malaise. Patient reports that he had been in his usual state of good health until approximately one week ago when he developed copious clear rhinorrhea and sore throat. As these symptoms began to improve, patient developed a cough productive of thick white sputum and dyspnea with minimal exertion. He has also noted diffuse body aches and a general malaise. He reports subjective fevers and chills, but denies chest pain or palpitations. There has been no leg swelling or orthopnea. He has no personal or family history of VTE. Patient saw his primary care physician today for evaluation of this, was noted to be saturating 90% on room air and wheezing, was treated with DuoNeb in the office, rapid flu test was negative, and the patient was directed to the emergency department for further evaluation.  ED Course: Upon arrival to the ED, patient is found to be afebrile, saturating low 90s on room air, mildly tachypneic, and with diastolic blood pressure in the low 100s. EKG demonstrates sinus rhythm with PVC and T-wave flattening/inversion in the inferior leads. Chest x-ray findings are consistent with COPD without acute infiltrate. CMP and CBC are unremarkable, lactic acid is reassuring at 1.48, and troponin is undetectable. Blood cultures were obtained and the patient was treated with 125 mg IV Solu-Medrol, azithromycin, and duo nebs. Patient had some mild subjective improvement with these measures in the emergency department, but continued to be significantly dyspneic with minimal exertion. He will  be observed on the medical surgical unit for ongoing evaluation and management of suspected COPD with acute exacerbation, likely with infectious precipitant.  Review of Systems:  All other systems reviewed and apart from HPI, are negative.  History reviewed. No pertinent past medical history.  History reviewed. No pertinent surgical history.   reports that he has been smoking.  He has never used smokeless tobacco. He reports that he does not drink alcohol. His drug history is not on file.  No Known Allergies  History reviewed. No pertinent family history.   Prior to Admission medications   Not on File    Physical Exam: Vitals:   12/05/15 2230 12/05/15 2330 12/06/15 0000 12/06/15 0030  BP: (!) 148/109 131/87 140/99 (!) 146/109  Pulse: 73 77 77 74  Resp: 23 21 18 20   Temp:      TempSrc:      SpO2: 90% 96% 90% (!) 89%  Weight:      Height:          Constitutional: Mildly tachypneic, pausing every 4 words or so for breath; no pallor  Eyes: PERTLA, lids and conjunctivae normal ENMT: Mucous membranes are moist. Posterior pharynx clear of any exudate or lesions.   Neck: normal, supple, no masses, no thyromegaly Respiratory: Diminished bilaterally with scattered wheezes. No accessory muscle use.  Cardiovascular: S1 & S2 heard, regular rate and rhythm. No extremity edema. No significant JVD. Abdomen: No distension, no tenderness, no masses palpated. Bowel sounds normal.  Musculoskeletal: no clubbing / cyanosis. No joint deformity upper and lower extremities. Normal muscle tone.  Skin: no significant rashes, lesions, ulcers. Warm,  dry, well-perfused. Neurologic: CN 2-12 grossly intact. Sensation intact, DTR normal. Strength 5/5 in all 4 limbs.  Psychiatric: Normal judgment and insight. Alert and oriented x 3. Normal mood and affect.     Labs on Admission: I have personally reviewed following labs and imaging studies  CBC:  Recent Labs Lab 12/05/15 2110  WBC 5.4    NEUTROABS 2.9  HGB 14.8  HCT 42.1  MCV 91.1  PLT 285   Basic Metabolic Panel:  Recent Labs Lab 12/05/15 2110  NA 140  K 3.9  CL 110  CO2 23  GLUCOSE 70  BUN 12  CREATININE 1.00  CALCIUM 8.8*   GFR: Estimated Creatinine Clearance: 102.7 mL/min (by C-G formula based on SCr of 1 mg/dL). Liver Function Tests: No results for input(s): AST, ALT, ALKPHOS, BILITOT, PROT, ALBUMIN in the last 168 hours. No results for input(s): LIPASE, AMYLASE in the last 168 hours. No results for input(s): AMMONIA in the last 168 hours. Coagulation Profile: No results for input(s): INR, PROTIME in the last 168 hours. Cardiac Enzymes: No results for input(s): CKTOTAL, CKMB, CKMBINDEX, TROPONINI in the last 168 hours. BNP (last 3 results) No results for input(s): PROBNP in the last 8760 hours. HbA1C: No results for input(s): HGBA1C in the last 72 hours. CBG: No results for input(s): GLUCAP in the last 168 hours. Lipid Profile: No results for input(s): CHOL, HDL, LDLCALC, TRIG, CHOLHDL, LDLDIRECT in the last 72 hours. Thyroid Function Tests: No results for input(s): TSH, T4TOTAL, FREET4, T3FREE, THYROIDAB in the last 72 hours. Anemia Panel: No results for input(s): VITAMINB12, FOLATE, FERRITIN, TIBC, IRON, RETICCTPCT in the last 72 hours. Urine analysis:    Component Value Date/Time   COLORURINE AMBER (A) 09/04/2010 0050   APPEARANCEUR CLEAR 09/04/2010 0050   LABSPEC 1.031 (H) 09/04/2010 0050   PHURINE 5.5 09/04/2010 0050   GLUCOSEU NEGATIVE 09/04/2010 0050   HGBUR NEGATIVE 09/04/2010 0050   BILIRUBINUR SMALL (A) 09/04/2010 0050   KETONESUR 15 (A) 09/04/2010 0050   PROTEINUR NEGATIVE 09/04/2010 0050   UROBILINOGEN 1.0 09/04/2010 0050   NITRITE NEGATIVE 09/04/2010 0050   LEUKOCYTESUR NEGATIVE 09/04/2010 0050   Sepsis Labs: (procalcitonin:4,lacticidven:4) )No results found for this or any previous visit (from the past 240 hour(s)).   Radiological Exams on Admission: Dg  Chest 2 View  Result Date: 12/05/2015 CLINICAL DATA:  Productive cough and body ache EXAM: CHEST  2 VIEW COMPARISON:  09/04/2010 FINDINGS: Upper lobe predominant bullous emphysema is again seen with crowding of lower lobe interstitial lung markings. There is hyperinflation of the lungs bilaterally without pneumonic consolidation, CHF, effusions or pneumothorax. The heart and mediastinal contours are stable with mild uncoiling of the thoracic aorta. No acute osseous abnormality. IMPRESSION: COPD without acute pneumonic consolidation. Electronically Signed   By: Tollie Eth M.D.   On: 12/05/2015 20:52    EKG: Independently reviewed. Sinus rhythm, PVC, T-wave flattening/inversion inferioly  Assessment/Plan  1. COPD with acute exacerbation  - Pt has not been formally diagnosed with COPD, but CXR findings are highly suggestive  - The acute exacerbation seems to have been precipitated by acute viral URI, possibly with secondary bacterial involvement - Reportedly had negative rapid flu in outpatient clinic just PTA  - He was treated with nebs, 125 mg IV Solu-Medrol, and azithromycin in ED  - Continue DuoNeb, Solu-Medrol 60 mg IV q8h; check sputum culture and continue abx with Levaquin; check respiratory virus panel   2. Hypertension  - BP intermittently elevated in ED  -  Monitor and treat with hydralazine IVP's prn overnight; consider scheduling an antihypertensive if elevations persist  3. Tobacco abuse - Counseled toward cessation  - RN asked to provide smoking-cessation information prior to discharge    DVT prophylaxis: sq Lovenox  Code Status: Full  Family Communication: Discussed with patient Disposition Plan: Observe on med-surg Consults called: None Admission status: Observation    Briscoe Deutscher, MD [facility] Pager [phone]  If 7PM-7AM, please contact night-coverage [email or web address] Password [email or web address]  12/06/2015, 1:20 AM

## 2015-12-06 NOTE — Discharge Summary (Addendum)
Physician Discharge Summary  Ian Santana WUJ:[id number] DOB: 1960/08/08 DOA: 12/05/2015  PCP: No primary care provider on file.  Admit date: 12/05/2015 Discharge date: 12/07/2015  Admitted From: Home Disposition:  Home  Recommendations for Outpatient Follow-up:  1. Follow up with PCP in 1-2 weeks 2. Recommend outpatient PFT's after acute illness resolves 3. Please monitor blood pressure and treat accordingly 4. Recommend outpatient work up for abnormal weight loss 5. Recommend referral to GI for colonoscopy 6. Recommend repeat chest imaging in 2-3 weeks  Discharge Condition: Improved CODE STATUS:Full Diet recommendation: Regular   Brief/Interim Summary: 55 y.o. male with medical history significant for ongoing tobacco abuse who presents to the emergency department at the direction of his PCP for evaluation of cough, dyspnea, and malaise. Patient reports that he had been in his usual state of good health until approximately one week ago when he developed copious clear rhinorrhea and sore throat. As these symptoms began to improve, patient developed a cough productive of thick white sputum and dyspnea with minimal exertion. He has also noted diffuse body aches and a general malaise. He reports subjective fevers and chills, but denies chest pain or palpitations. There has been no leg swelling or orthopnea. He has no personal or family history of VTE. Patient saw his primary care physician today for evaluation of this, was noted to be saturating 90% on room air and wheezing, was treated with DuoNeb in the office, rapid flu test was negative, and the patient was directed to the emergency department for further evaluation.  1. COPD with acute exacerbation  - Pt has not been formally diagnosed with COPD, but CXR findings are highly suggestive  - The acute exacerbation seems to have been precipitated by acute viral URI - Reportedly had negative rapid flu in outpatient clinic just PTA  - He was  treated with nebs, 125 mg IV Solu-Medrol, and azithromycin in ED  - Patient continued on duonebs and IV steroids with notable improvement - Patient was started on empiric antibiotics at time of admission - CXR reviewed, no infiltrate noted. No leukocytosis - Recommend steroid taper on discharge. D/c abx at time of discharge  2. Hypertension  - BP intermittently elevated in ED and on floor - Given trial of low dose lisinopril. No known drug allergies were listed at the time - Overnight, patient noted to have marked tongue swelling, resolved with steroids and benadryl - Will have PCP follow for blood pressure management  3. Tobacco abuse - Counseled toward cessation   4. Rhinovirus  - Respiratory culture confirms rhinovirus - Continue supportive care  5. Anaphylaxis to ACEI - See above. Likely drug reaction to ACEI - Lisinopril has now been listed as drug allergy - Anaphylaxis has resolved  6. Abnormal Weight Loss - Patient reports just under 30lb wt loss over past 4 months - Patient is colonoscopy naive. Recommended to patient to follow up with PCP for GI referral for colonoscopy - Also consider repeat chest imaging in 2-3 weeks  Discharge Diagnoses:  Principal Problem:   COPD exacerbation (HCC) Active Problems:   Acute URI   Hypertension   Current smoker   COPD with acute exacerbation (HCC)   Rhinovirus infection   Abnormal weight loss    Discharge Instructions     Medication List    TAKE these medications   albuterol 108 (90 Base) MCG/ACT inhaler Commonly known as:  PROVENTIL HFA;VENTOLIN HFA Inhale 2 puffs into the lungs every 6 (six) hours as needed for wheezing or  shortness of breath.   benzonatate 100 MG capsule Commonly known as:  TESSALON Take 1 capsule (100 mg total) by mouth 3 (three) times daily as needed for cough.   diphenhydrAMINE 25 MG tablet Commonly known as:  BENADRYL Take 1 tablet (25 mg total) by mouth every 6 (six) hours as needed for  itching or allergies.   ketorolac 10 MG tablet Commonly known as:  TORADOL Take 1 tablet (10 mg total) by mouth every 6 (six) hours as needed.   predniSONE 5 MG tablet Commonly known as:  DELTASONE Take 1 tablet (5 mg total) by mouth daily with breakfast.      Follow-up Information    Follow up with your PCP. Schedule an appointment as soon as possible for a visit in 2 week(s).          Allergies  Allergen Reactions  . Lisinopril Anaphylaxis    Swelling of lips and tongue     Procedures/Studies: Dg Chest 2 View  Result Date: 12/05/2015 CLINICAL DATA:  Productive cough and body ache EXAM: CHEST  2 VIEW COMPARISON:  09/04/2010 FINDINGS: Upper lobe predominant bullous emphysema is again seen with crowding of lower lobe interstitial lung markings. There is hyperinflation of the lungs bilaterally without pneumonic consolidation, CHF, effusions or pneumothorax. The heart and mediastinal contours are stable with mild uncoiling of the thoracic aorta. No acute osseous abnormality. IMPRESSION: COPD without acute pneumonic consolidation. Electronically Signed   By: Tollie Eth M.D.   On: 12/05/2015 20:52    Subjective: Feels better today  Discharge Exam: Vitals:   12/07/15 0432 12/07/15 0433  BP:  (!) 132/98  Pulse: 74   Resp: 18   Temp: 98.2 F (36.8 C)    Vitals:   12/07/15 0016 12/07/15 0057 12/07/15 0432 12/07/15 0433  BP: (!) 139/92 (!) 129/99  (!) 132/98  Pulse: 70 60 74   Resp: (!) 22 (!) 21 18   Temp: 97.8 F (36.6 C) 98.2 F (36.8 C) 98.2 F (36.8 C)   TempSrc: Oral Oral Oral   SpO2: 98% 98% 98%   Weight:      Height:        General: Pt is alert, awake, not in acute distress Cardiovascular: RRR, S1/S2 +, no rubs, no gallops Respiratory: CTA bilaterally, no wheezing, no rhonchi Abdominal: Soft, NT, ND, bowel sounds + Extremities: no edema, no cyanosis   The results of significant diagnostics from this hospitalization (including imaging, microbiology,  ancillary and laboratory) are listed below for reference.     Microbiology: Recent Results (from the past 240 hour(s))  Culture, blood (routine x 2)     Status: None (Preliminary result)   Collection Time: 12/05/15  9:11 PM  Result Value Ref Range Status   Specimen Description BLOOD BLOOD RIGHT FOREARM  Final   Special Requests IN PEDIATRIC BOTTLE 1CC  Final   Culture   Final    NO GROWTH < 24 HOURS Performed at [facility]    Report Status PENDING  Incomplete  Culture, blood (routine x 2)     Status: None (Preliminary result)   Collection Time: 12/05/15  9:55 PM  Result Value Ref Range Status   Specimen Description BLOOD LEFT ANTECUBITAL  Final   Special Requests BOTTLES DRAWN AEROBIC AND ANAEROBIC 5CC  Final   Culture   Final    NO GROWTH < 24 HOURS Performed at [facility]    Report Status PENDING  Incomplete  Respiratory Panel by PCR  Status: Abnormal   Collection Time: 12/06/15  1:19 AM  Result Value Ref Range Status   Adenovirus NOT DETECTED NOT DETECTED Final   Coronavirus 229E NOT DETECTED NOT DETECTED Final   Coronavirus HKU1 NOT DETECTED NOT DETECTED Final   Coronavirus NL63 NOT DETECTED NOT DETECTED Final   Coronavirus OC43 NOT DETECTED NOT DETECTED Final   Metapneumovirus NOT DETECTED NOT DETECTED Final   Rhinovirus / Enterovirus DETECTED (A) NOT DETECTED Final   Influenza A NOT DETECTED NOT DETECTED Final   Influenza B NOT DETECTED NOT DETECTED Final   Parainfluenza Virus 1 NOT DETECTED NOT DETECTED Final   Parainfluenza Virus 2 NOT DETECTED NOT DETECTED Final   Parainfluenza Virus 3 NOT DETECTED NOT DETECTED Final   Parainfluenza Virus 4 NOT DETECTED NOT DETECTED Final   Respiratory Syncytial Virus NOT DETECTED NOT DETECTED Final   Bordetella pertussis NOT DETECTED NOT DETECTED Final   Chlamydophila pneumoniae NOT DETECTED NOT DETECTED Final   Mycoplasma pneumoniae NOT DETECTED NOT DETECTED Final    Comment: Performed at [facility]     Labs: BNP (last 3 results) No results for input(s): BNP in the last 8760 hours. Basic Metabolic Panel:  Recent Labs Lab 12/05/15 2110  NA 140  K 3.9  CL 110  CO2 23  GLUCOSE 70  BUN 12  CREATININE 1.00  CALCIUM 8.8*   Liver Function Tests: No results for input(s): AST, ALT, ALKPHOS, BILITOT, PROT, ALBUMIN in the last 168 hours. No results for input(s): LIPASE, AMYLASE in the last 168 hours. No results for input(s): AMMONIA in the last 168 hours. CBC:  Recent Labs Lab 12/05/15 2110  WBC 5.4  NEUTROABS 2.9  HGB 14.8  HCT 42.1  MCV 91.1  PLT 285   Cardiac Enzymes: No results for input(s): CKTOTAL, CKMB, CKMBINDEX, TROPONINI in the last 168 hours. BNP: Invalid input(s): POCBNP CBG:  Recent Labs Lab 12/06/15 0747 12/07/15 0756  GLUCAP 217* 105*   D-Dimer No results for input(s): DDIMER in the last 72 hours. Hgb A1c No results for input(s): HGBA1C in the last 72 hours. Lipid Profile No results for input(s): CHOL, HDL, LDLCALC, TRIG, CHOLHDL, LDLDIRECT in the last 72 hours. Thyroid function studies No results for input(s): TSH, T4TOTAL, T3FREE, THYROIDAB in the last 72 hours.  Invalid input(s): FREET3 Anemia work up No results for input(s): VITAMINB12, FOLATE, FERRITIN, TIBC, IRON, RETICCTPCT in the last 72 hours. Urinalysis    Component Value Date/Time   COLORURINE AMBER (A) 09/04/2010 0050   APPEARANCEUR CLEAR 09/04/2010 0050   LABSPEC 1.031 (H) 09/04/2010 0050   PHURINE 5.5 09/04/2010 0050   GLUCOSEU NEGATIVE 09/04/2010 0050   HGBUR NEGATIVE 09/04/2010 0050   BILIRUBINUR SMALL (A) 09/04/2010 0050   KETONESUR 15 (A) 09/04/2010 0050   PROTEINUR NEGATIVE 09/04/2010 0050   UROBILINOGEN 1.0 09/04/2010 0050   NITRITE NEGATIVE 09/04/2010 0050   LEUKOCYTESUR NEGATIVE 09/04/2010 0050   Sepsis Labs Invalid input(s): PROCALCITONIN,  WBC,  LACTICIDVEN Microbiology Recent Results (from the past 240 hour(s))  Culture, blood (routine x 2)      Status: None (Preliminary result)   Collection Time: 12/05/15  9:11 PM  Result Value Ref Range Status   Specimen Description BLOOD BLOOD RIGHT FOREARM  Final   Special Requests IN PEDIATRIC BOTTLE 1CC  Final   Culture   Final    NO GROWTH < 24 HOURS Performed at [facility]    Report Status PENDING  Incomplete  Culture, blood (routine x 2)  Status: None (Preliminary result)   Collection Time: 12/05/15  9:55 PM  Result Value Ref Range Status   Specimen Description BLOOD LEFT ANTECUBITAL  Final   Special Requests BOTTLES DRAWN AEROBIC AND ANAEROBIC 5CC  Final   Culture   Final    NO GROWTH < 24 HOURS Performed at [facility]    Report Status PENDING  Incomplete  Respiratory Panel by PCR     Status: Abnormal   Collection Time: 12/06/15  1:19 AM  Result Value Ref Range Status   Adenovirus NOT DETECTED NOT DETECTED Final   Coronavirus 229E NOT DETECTED NOT DETECTED Final   Coronavirus HKU1 NOT DETECTED NOT DETECTED Final   Coronavirus NL63 NOT DETECTED NOT DETECTED Final   Coronavirus OC43 NOT DETECTED NOT DETECTED Final   Metapneumovirus NOT DETECTED NOT DETECTED Final   Rhinovirus / Enterovirus DETECTED (A) NOT DETECTED Final   Influenza A NOT DETECTED NOT DETECTED Final   Influenza B NOT DETECTED NOT DETECTED Final   Parainfluenza Virus 1 NOT DETECTED NOT DETECTED Final   Parainfluenza Virus 2 NOT DETECTED NOT DETECTED Final   Parainfluenza Virus 3 NOT DETECTED NOT DETECTED Final   Parainfluenza Virus 4 NOT DETECTED NOT DETECTED Final   Respiratory Syncytial Virus NOT DETECTED NOT DETECTED Final   Bordetella pertussis NOT DETECTED NOT DETECTED Final   Chlamydophila pneumoniae NOT DETECTED NOT DETECTED Final   Mycoplasma pneumoniae NOT DETECTED NOT DETECTED Final    Comment: Performed at [facility]     SIGNED:   Jerald KiefHIU, STEPHEN K, MD  [facility] 12/07/2015, 11:02 AM  If 7PM-7AM, please contact night-coverage [email or web address] Password  [email or web address]

## 2015-12-06 NOTE — Progress Notes (Signed)
Same Day Note. Patient admitted after midnight this AM.  Presents with fevers and URI and sob. Patient admitted for copd exacerbation.  1. COPD with acute exacerbation  - Pt has not been formally diagnosed with COPD, but CXR findings are highly suggestive  - The acute exacerbation seems to have been precipitated by acute viral URI - Reportedly had negative rapid flu in outpatient clinic just PTA  - He was treated with nebs, 125 mg IV Solu-Medrol, and azithromycin in ED  - Patient continued on duonebs and IV steroids with notable improvement - Patient was started on empiric antibiotics at time of admission - CXR reviewed, no infiltrate noted. No leukocytosis - This afternoon, patient reports marked sob and dyspnea. Will continue iv steroids  2. Hypertension  - BP intermittently elevated in ED and on floor - Will prescribe low dose ACEI   3. Tobacco abuse - Counseled toward cessation  - RN asked to provide smoking-cessation information prior to discharge  4. Rhinovirus - Respiratory culture confirms rhinovirus - Continue supportive care  Rickey BarbaraStephen Chiu [facility] Pager: [phone]

## 2015-12-06 NOTE — Progress Notes (Signed)
Initial Nutrition Assessment  DOCUMENTATION CODES:   Severe malnutrition in context of acute illness/injury  INTERVENTION:   -Provide Ensure Enlive po BID, each supplement provides 350 kcal and 20 grams of protein -Encourage PO intake (emphasizing protein consumption) -RD to continue to monitor  NUTRITION DIAGNOSIS:   Increased nutrient needs related to other (see comment) (COPD ) as evidenced by estimated needs.  GOAL:   Patient will meet greater than or equal to 90% of their needs  MONITOR:   PO intake, Supplement acceptance, Labs, Weight trends, I & O's  REASON FOR ASSESSMENT:   Malnutrition Screening Tool    ASSESSMENT:   55 y.o. male with medical history significant for ongoing tobacco abuse who presents to the emergency department at the direction of his PCP for evaluation of cough, dyspnea, and malaise. Patient reports that he had been in his usual state of good health until approximately one week ago when he developed copious clear rhinorrhea and sore throat. As these symptoms began to improve, patient developed a cough productive of thick white sputum and dyspnea with minimal exertion. He has also noted diffuse body aches and a general malaise.   Pt in room with no family at bedside. Pt reports poor appetite and poor PO intake for a week PTA. States he has had SOB and dry mouth which has affected his taste of foods. Pt currently is consuming 100% of meals so far, states he thinks the food is good here. Pt is agreeable to receiving Ensure supplements for additional kcal and protein given increased needs.  Pt reports UBW of 214 lb, last weighed this 1.5 months ago. This would be a 24% weight loss x 1.5 months which is significant for time frame). Nutrition-Focused physical exam completed. Findings are no fat depletion, moderate muscle depletion, and no edema.   Medications reviewed. Labs reviewed: CBGs: 217  Diet Order:  Diet regular Room service appropriate? Yes;  Fluid consistency: Thin  Skin:  Reviewed, no issues  Last BM:  10/25  Height:   Ht Readings from Last 1 Encounters:  12/06/15 6\' 5"  (1.956 m)    Weight:   Wt Readings from Last 1 Encounters:  12/06/15 162 lb 0.6 oz (73.5 kg)    Ideal Body Weight:  94.5 kg  BMI:  Body mass index is 19.21 kg/m.  Estimated Nutritional Needs:   Kcal:  2200-2400  Protein:  110-120g  Fluid:  2.2-2.4L/day  EDUCATION NEEDS:   Education needs addressed  Tilda FrancoLindsey Raihana Balderrama, MS, RD, LDN Pager: [phone] After Hours Pager: [phone]

## 2015-12-06 NOTE — ED Notes (Signed)
Provided a ham sandwich.

## 2015-12-06 NOTE — ED Notes (Signed)
Gave report to Meredith, RN

## 2015-12-07 DIAGNOSIS — R634 Abnormal weight loss: Secondary | ICD-10-CM

## 2015-12-07 LAB — GLUCOSE, CAPILLARY: Glucose-Capillary: 105 mg/dL — ABNORMAL HIGH (ref 65–99)

## 2015-12-07 MED ORDER — METHYLPREDNISOLONE SODIUM SUCC 125 MG IJ SOLR
125.0000 mg | Freq: Once | INTRAMUSCULAR | Status: AC
  Administered 2015-12-07: 125 mg via INTRAVENOUS

## 2015-12-07 MED ORDER — DIPHENHYDRAMINE HCL 50 MG/ML IJ SOLN
INTRAMUSCULAR | Status: AC
  Filled 2015-12-07: qty 1

## 2015-12-07 MED ORDER — DIPHENHYDRAMINE HCL 25 MG PO TABS: 25.0000 mg | ORAL_TABLET | Freq: Four times a day (QID) | ORAL | 0 refills | Status: AC | PRN

## 2015-12-07 MED ORDER — BENZONATATE 100 MG PO CAPS: 100.0000 mg | ORAL_CAPSULE | Freq: Three times a day (TID) | ORAL | 0 refills | Status: AC | PRN

## 2015-12-07 MED ORDER — ALBUTEROL SULFATE HFA 108 (90 BASE) MCG/ACT IN AERS: 1.0000 | INHALATION_SPRAY | RESPIRATORY_TRACT | Status: DC | PRN

## 2015-12-07 MED ORDER — LISINOPRIL 10 MG PO TABS: 5.0000 mg | ORAL_TABLET | Freq: Every day | ORAL | Status: DC

## 2015-12-07 MED ORDER — ALBUTEROL SULFATE HFA 108 (90 BASE) MCG/ACT IN AERS
1.0000 | INHALATION_SPRAY | RESPIRATORY_TRACT | Status: DC | PRN
  Filled 2015-12-07: qty 6.7

## 2015-12-07 MED ORDER — ALBUTEROL SULFATE (2.5 MG/3ML) 0.083% IN NEBU: 2.5000 mg | INHALATION_SOLUTION | RESPIRATORY_TRACT | Status: DC | PRN

## 2015-12-07 MED ORDER — METHYLPREDNISOLONE SODIUM SUCC 125 MG IJ SOLR
INTRAMUSCULAR | Status: AC
  Filled 2015-12-07: qty 2

## 2015-12-07 MED ORDER — DIPHENHYDRAMINE HCL 50 MG PO CAPS: 50.0000 mg | ORAL_CAPSULE | ORAL | Status: DC

## 2015-12-07 MED ORDER — DIPHENHYDRAMINE HCL 50 MG PO CAPS
50.0000 mg | ORAL_CAPSULE | Freq: Once | ORAL | Status: AC
  Administered 2015-12-07: 50 mg via ORAL

## 2015-12-07 MED ORDER — DIPHENHYDRAMINE HCL 50 MG PO CAPS
ORAL_CAPSULE | ORAL | Status: AC
  Filled 2015-12-07: qty 1

## 2015-12-07 NOTE — Progress Notes (Signed)
This CM provided pt with Goodrx coupons for Toradol, Prednisone, and Albuterol. Pt states he does have a PCP and he is working on Health visitorgetting Medicaid and an [employer] Card. No other CM needs communicated. Sandford Crazeora Senica Crall RN,BSN,NCM [phone]

## 2015-12-07 NOTE — Significant Event (Signed)
Rapid Response Event Note  Overview: Time Called: 0020 Arrival Time: 0025 Event Type: Other (Comment) (Tongue Swelling after first Lisinopril dose)  Initial Focused Assessment:  10/27 @ 0020 - Called by Ian DrownBernita,RN due to [name] complaining of his tongue swelling.  On my arrival pt lying in bed, able to speak clearly, no respiratory distress noted, denies any shortness of breath.  Pt able to stick tongue out, which did look swollen. Pt connected to RRT monitor.  VS stable with BP 144/93 HR 63 RR 14 02 Sats 98% on 2L-[place] (which he was on prior to this episode).  Breath sounds - diminished throughout, but no wheezing or strider heard.  Ian Santana, pt's primary nurse had already paged and talked with Dr. Sharyon Santana.  He ordered Solu-Medrol 125mg  IV and Benedryl 50mg  PO, both meds given on my arrival into room.  Stayed in room to monitor patient response.  At 0045, pt stated that he felt the swelling in his tongue was decreasing.  Pt's tongue now to [facility] and me looked less swollen.  Relistened to pt's breath sounds, which now I hear more air movement, Rhonchi in bases, had pt deep breath and cough, which produced thick sputum, but also cleared the rhonchi.  Pt's condition improved.  Ian Santana, stated that she feels ok with pt's current condition.  Interventions:  Dr. Sharyon Santana called prior to my arrival (Solu-medrol and Benadryl ordered) / VS taken / quick assessment done  Plan of Care (if not transferred):  To patient:  Notify your nurse if you feel like your tongue is getting more swollen or having any difficulty breathing. To Nurse:  Notify me if any change in condition.  Only other recommendation I have was recommending a dose of Pepcid to the MD.  Event Summary:  Name of Physician Notified: Dr. Sharyon Santana (paged prior to my arrival by [facility]) at 0020  Outcome: Stayed in room and stabalized  Event End Time: 0050  Ian CashHans C Niko Penson,RN,BSN,CCRN [facility] Night Shift Care Coordinator / Rapid Response  Nurse

## 2015-12-07 NOTE — Progress Notes (Signed)
Nursing Note: In room to give meds due for midnight.Pt says "my tongue is swollen".Asked pt to stick his tongue out and his tongue was very swollen on the left side.A; Paged on-call and paged rapid response nurse.T- 97.8 p-70 r-22 BP- 139/92.PO2 98% on r/a.Note pt received his first dose of lisinopril earlier this evening prior to shift change.

## 2015-12-07 NOTE — Progress Notes (Signed)
Nursing Note: Solumedrol 125 mg IV given.At 0032 benadryl 50 mg po given as pt says he can swallow it the patient's tongue is now huge but was able swallow without difficulty.Rapid response nurse is at the bedside.wbb

## 2015-12-07 NOTE — Progress Notes (Signed)
Nursing Note: Pt feeling better and says his tongue has less swelling since meds were given.Pt stuck tongue out and  it is smaller ,swelling has decreased..Pt denied difficulty breathing.Pt had already been on oxygen @ 2L n/c.T-98.2 P-60 R-21 BP-129/99 PO2 98% on @L  n/c.Pt resting quietly in bed.Lisinopril discontinued and added to allergy list in the system and pt educated about allergic reaction and that this is a life threatening allergy.Will continue to monitor.wbb

## 2015-12-10 LAB — CULTURE, BLOOD (ROUTINE X 2)
CULTURE: NO GROWTH
CULTURE: NO GROWTH
